# Patient Record
Sex: Male | Born: 1955 | Race: White | Hispanic: No | Smoking: Current every day smoker
Health system: Southern US, Community
[De-identification: ages and names within clinical notes are randomized; demographics above are authoritative.]

## PROBLEM LIST (undated history)

## (undated) DIAGNOSIS — R0602 Shortness of breath: Secondary | ICD-10-CM

## (undated) DIAGNOSIS — M1990 Unspecified osteoarthritis, unspecified site: Secondary | ICD-10-CM

## (undated) HISTORY — PX: COLONOSCOPY: SHX174

---

## 2004-12-31 ENCOUNTER — Emergency Department: Payer: Self-pay | Admitting: Unknown Physician Specialty

## 2007-03-01 ENCOUNTER — Ambulatory Visit: Payer: Self-pay | Admitting: Gastroenterology

## 2012-12-06 ENCOUNTER — Ambulatory Visit: Payer: Self-pay

## 2014-05-03 IMAGING — CR DG CHEST 2V
1 series · 2 of 2 positions shown · non-contrast
Comparison: none

REASON FOR EXAM: cough smoker
COMMENTS:

[Series 1: pa · 0.17mm/px · 2 of 2 slices shown]
[im 1/2]
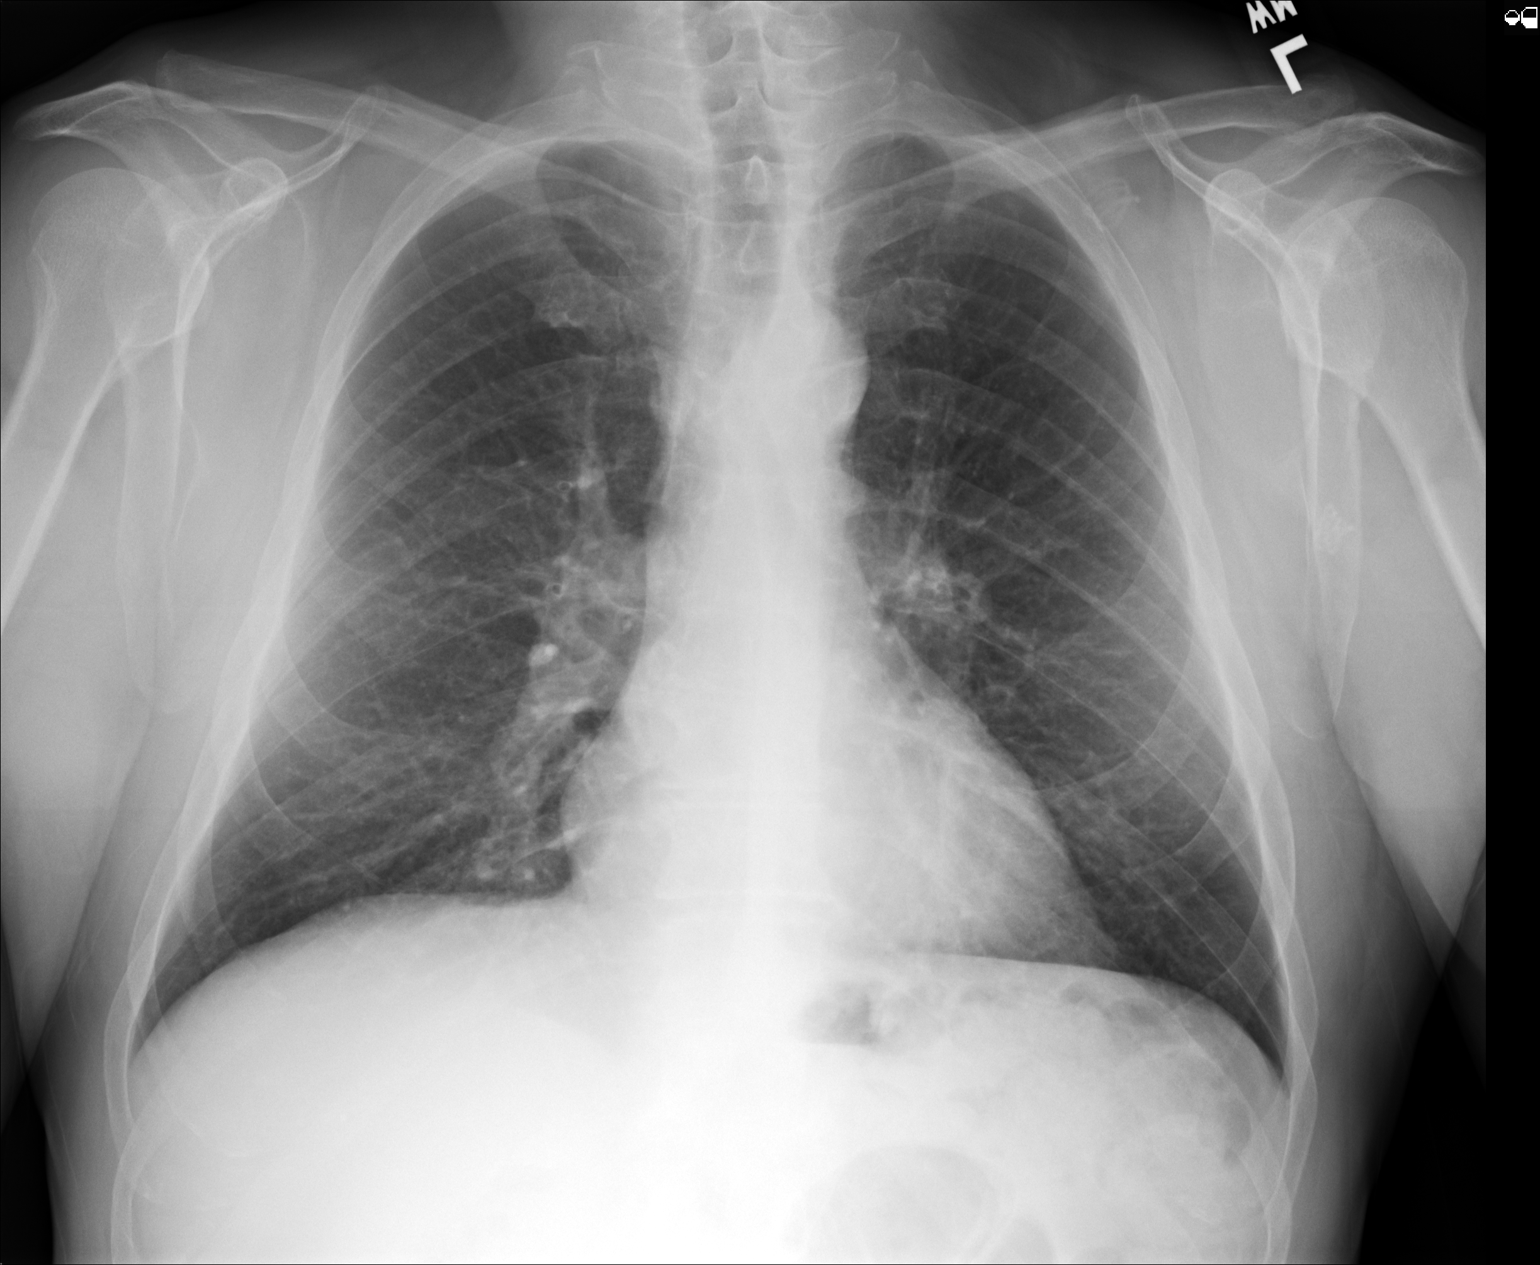
[im 2/2]
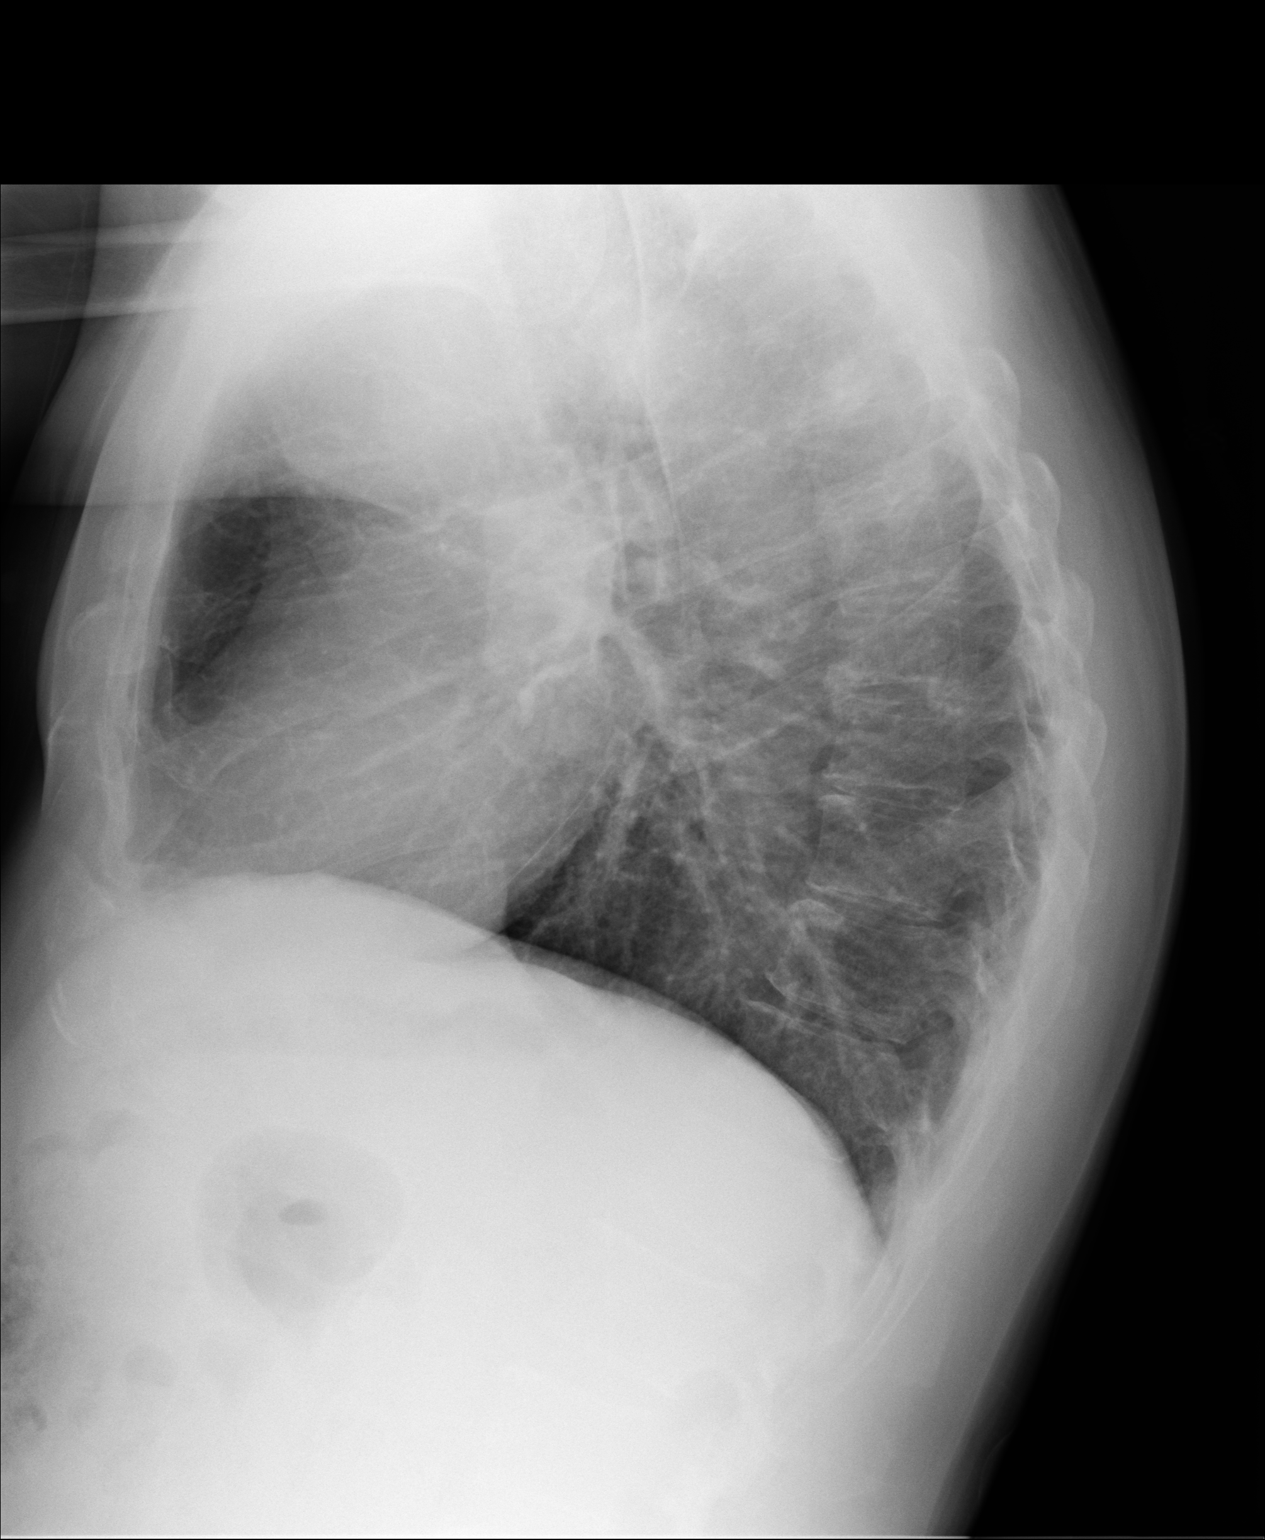

[2 of 2 positions shown; findings below may reference images not displayed]

PROCEDURE:     CECILE - CECILE CHEST PA (OR AP) AND LAT  - December 06, 2012  [DATE]

RESULT:     The lungs are adequately inflated. There is no focal infiltrate.
The cardiac silhouette is not enlarged. The central pulmonary vascularity is
mildly prominent. There is no pleural effusion or pneumothorax.
IMPRESSION: There is no evidence of pneumonia nor CHF. I cannot exclude
acute bronchitis in the appropriate clinical setting.

[REDACTED]

## 2020-06-19 ENCOUNTER — Emergency Department
Admission: EM | Admit: 2020-06-19 | Discharge: 2020-06-19 | Disposition: A | Discharge status: Home / Self Care | Payer: Medicare Other | Attending: Emergency Medicine | Admitting: Emergency Medicine

## 2020-06-19 ENCOUNTER — Emergency Department: Payer: Medicare Other

## 2020-06-19 ENCOUNTER — Other Ambulatory Visit: Payer: Self-pay

## 2020-06-19 DIAGNOSIS — Z23 Encounter for immunization: Secondary | ICD-10-CM | POA: Diagnosis not present

## 2020-06-19 DIAGNOSIS — Z2914 Encounter for prophylactic rabies immune globin: Secondary | ICD-10-CM | POA: Insufficient documentation

## 2020-06-19 DIAGNOSIS — S61051A Open bite of right thumb without damage to nail, initial encounter: Secondary | ICD-10-CM | POA: Diagnosis present

## 2020-06-19 DIAGNOSIS — Z203 Contact with and (suspected) exposure to rabies: Secondary | ICD-10-CM | POA: Insufficient documentation

## 2020-06-19 DIAGNOSIS — W540XXA Bitten by dog, initial encounter: Secondary | ICD-10-CM | POA: Diagnosis not present

## 2020-06-19 DIAGNOSIS — S61451A Open bite of right hand, initial encounter: Secondary | ICD-10-CM

## 2020-06-19 LAB — BASIC METABOLIC PANEL
Anion gap: 14 (ref 5–15)
BUN: 13 mg/dL (ref 8–23)
CO2: 21 mmol/L — ABNORMAL LOW (ref 22–32)
Calcium: 9 mg/dL (ref 8.9–10.3)
Chloride: 100 mmol/L (ref 98–111)
Creatinine, Ser: 1.06 mg/dL (ref 0.61–1.24)
GFR, Estimated: >60 mL/min (ref >60)
Glucose, Bld: 110 mg/dL — ABNORMAL HIGH (ref 70–99)
Potassium: 4 mmol/L (ref 3.5–5.1)
Sodium: 135 mmol/L (ref 135–145)

## 2020-06-19 LAB — CBC WITH DIFFERENTIAL/PLATELET
Abs Immature Granulocytes: 0.21 10*3/uL — ABNORMAL HIGH (ref 0.00–0.07)
Basophils Absolute: 0.1 10*3/uL (ref 0.0–0.1)
Basophils Relative: 0 %
Eosinophils Absolute: 0 10*3/uL (ref 0.0–0.5)
Eosinophils Relative: 0 %
HCT: 40.6 % (ref 39.0–52.0)
Hemoglobin: 13.9 g/dL (ref 13.0–17.0)
Immature Granulocytes: 1 %
Lymphocytes Relative: 6 %
Lymphs Abs: 1 10*3/uL (ref 0.7–4.0)
MCH: 38 pg — ABNORMAL HIGH (ref 26.0–34.0)
MCHC: 34.2 g/dL (ref 30.0–36.0)
MCV: 110.9 fL — ABNORMAL HIGH (ref 80.0–100.0)
Monocytes Absolute: 0.8 10*3/uL (ref 0.1–1.0)
Monocytes Relative: 5 %
Neutro Abs: 14.9 10*3/uL — ABNORMAL HIGH (ref 1.7–7.7)
Neutrophils Relative %: 88 %
Platelets: 154 10*3/uL (ref 150–400)
RBC: 3.66 MIL/uL — ABNORMAL LOW (ref 4.22–5.81)
RDW: 14.7 % (ref 11.5–15.5)
Smear Review: NORMAL
WBC: 17.2 10*3/uL — ABNORMAL HIGH (ref 4.0–10.5)
nRBC: 0.2 % (ref 0.0–0.2)

## 2020-06-19 MED ORDER — KETOROLAC TROMETHAMINE 30 MG/ML IJ SOLN
30.0000 mg | Freq: Once | INTRAMUSCULAR | Status: AC
  Administered 2020-06-19: 30 mg via INTRAVENOUS
  Filled 2020-06-19: qty 1

## 2020-06-19 MED ORDER — RABIES VACCINE, PCEC IM SUSR
1.0000 mL | Freq: Once | INTRAMUSCULAR | Status: AC
  Administered 2020-06-19: 1 mL via INTRAMUSCULAR
  Filled 2020-06-19: qty 1

## 2020-06-19 MED ORDER — TETANUS-DIPHTH-ACELL PERTUSSIS 5-2.5-18.5 LF-MCG/0.5 IM SUSP
0.5000 mL | Freq: Once | INTRAMUSCULAR | Status: AC
  Administered 2020-06-19: 0.5 mL via INTRAMUSCULAR
  Filled 2020-06-19: qty 0.5

## 2020-06-19 MED ORDER — TRAMADOL HCL 50 MG PO TABS
50.0000 mg | ORAL_TABLET | Freq: Once | ORAL | Status: AC
  Administered 2020-06-19: 50 mg via ORAL
  Filled 2020-06-19: qty 1

## 2020-06-19 MED ORDER — TRAMADOL HCL 50 MG PO TABS
50.0000 mg | ORAL_TABLET | Freq: Four times a day (QID) | ORAL | 0 refills | Status: AC | PRN
Start: 2020-06-19 — End: 2021-06-19

## 2020-06-19 MED ORDER — SODIUM CHLORIDE 0.9 % IV SOLN
1.0000 g | Freq: Once | INTRAVENOUS | Status: AC
  Administered 2020-06-19: 1 g via INTRAVENOUS
  Filled 2020-06-19: qty 10

## 2020-06-19 MED ORDER — RABIES IMMUNE GLOBULIN 150 UNIT/ML IM INJ
20.0000 [IU]/kg | INJECTION | Freq: Once | INTRAMUSCULAR | Status: AC
  Administered 2020-06-19: 1575 [IU] via INTRAMUSCULAR
  Filled 2020-06-19: qty 10.5

## 2020-06-19 MED ORDER — NAPROXEN 500 MG PO TABS: 500.0000 mg | ORAL_TABLET | Freq: Two times a day (BID) | ORAL | Status: DC

## 2020-06-19 MED ORDER — AMOXICILLIN-POT CLAVULANATE 875-125 MG PO TABS: 1.0000 | ORAL_TABLET | Freq: Two times a day (BID) | ORAL | 0 refills | Status: DC

## 2020-06-19 NOTE — ED Triage Notes (Signed)
Pt here with an animal bite to the left hand from a dog. Pt did not know of the dog or the owner. Pt c/o pain in the hand. Hand is red and swollen. Pt NAD in triage.

## 2020-06-19 NOTE — ED Notes (Signed)
Pt presents to the ED for a dog bite. Pt states that he does not know the dog or the owner. Has not notified animal control. Swelling and redness noted to R hand between thumb and index finger. Pt is A&Ox4 and NAD.

## 2020-06-19 NOTE — ED Provider Notes (Signed)
[facility] Emergency Department Provider Note   ____________________________________________   First MD Initiated Contact with Patient 06/19/20 1242     (approximate)  I have reviewed the triage vital signs and the nursing notes.   HISTORY  Chief Complaint Animal Bite    HPI Mark Best is a 64 y.o. male patient presents with dog bite right right thumb which occurred 4 days ago.  Patient did not know the animal or the owner.  Patient state increased pain and swelling since the incident.  Patient denies loss sensation or function of the right thumb.  Patient is right-hand dominant.  Patient rates pain as 5/10.  Described pain as "sore".  No palliative measures for complaint.        No past medical history on file.  There are no problems to display for this patient.     Prior to Admission medications   Medication Sig Start Date End Date Taking? Authorizing Provider  amoxicillin-clavulanate (AUGMENTIN) 875-125 MG tablet Take 1 tablet by mouth 2 (two) times daily. 06/19/20   Sable Feil, PA-C  naproxen (NAPROSYN) 500 MG tablet Take 1 tablet (500 mg total) by mouth 2 (two) times daily with a meal. 06/19/20   Sable Feil, PA-C  traMADol (ULTRAM) 50 MG tablet Take 1 tablet (50 mg total) by mouth every 6 (six) hours as needed. 06/19/20 06/19/21  Sable Feil, PA-C    Allergies Patient has no allergy information on record.  No family history on file.  Social History Social History   Tobacco Use  . Smoking status: Not on file  Substance Use Topics  . Alcohol use: Not on file  . Drug use: Not on file    Review of Systems Constitutional: No fever/chills Eyes: No visual changes. ENT: No sore throat. Cardiovascular: Denies chest pain. Respiratory: Denies shortness of breath. Gastrointestinal: No abdominal pain.  No nausea, no vomiting.  No diarrhea.  No constipation. Genitourinary: Negative for dysuria. Musculoskeletal: Negative  for back pain. Skin: Edema and erythema right thumb.  Neurological: Negative for headaches, focal weakness or numbness.   ____________________________________________   PHYSICAL EXAM:  VITAL SIGNS: ED Triage Vitals  Enc Vitals Group     BP 06/19/20 1232 (!) 142/109     Pulse Rate 06/19/20 1232 95     Resp 06/19/20 1232 18     Temp 06/19/20 1232 97.9 F (36.6 C)     Temp Source 06/19/20 1232 Oral     SpO2 06/19/20 1232 100 %     Weight 06/19/20 1230 170 lb (77.1 kg)     Height 06/19/20 1230 6\' 1"  (1.854 m)     Head Circumference --      Peak Flow --      Pain Score 06/19/20 1230 5     Pain Loc --      Pain Edu? --      Excl. in [place]? --     Constitutional: Alert and oriented. Well appearing and in no acute distress. Hematological/Lymphatic/Immunilogical: No cervical lymphadenopathy. Cardiovascular: Normal rate, regular rhythm. Grossly normal heart sounds.  Good peripheral circulation. Respiratory: Normal respiratory effort.  No retractions. Lungs CTAB. Musculoskeletal: No lower extremity tenderness nor edema.  No joint effusions. Neurologic:  Normal speech and language. No gross focal neurologic deficits are appreciated. No gait instability. Skin: Edema and erythema dorsal right thumb.   Psychiatric: Mood and affect are normal. Speech and behavior are normal.  ____________________________________________   LABS (all labs ordered  are listed, but only abnormal results are displayed)  Labs Reviewed  CBC WITH DIFFERENTIAL/PLATELET - Abnormal; Notable for the following components:      Result Value   WBC 17.2 (*)    RBC 3.66 (*)    MCV 110.9 (*)    MCH 38.0 (*)    Neutro Abs 14.9 (*)    Abs Immature Granulocytes 0.21 (*)    All other components within normal limits  BASIC METABOLIC PANEL - Abnormal; Notable for the following components:   CO2 21 (*)    Glucose, Bld 110 (*)    All other components within normal limits    ____________________________________________  EKG   ____________________________________________  RADIOLOGY I, Sable Feil, personally viewed and evaluated these images (plain radiographs) as part of my medical decision making, as well as reviewing the written report by the radiologist.  ED MD interpretation: No acute findings on x-ray of the right hand.  Official radiology report(s): DG Hand Complete Right  Result Date: 06/19/2020 CLINICAL DATA:  Animal bite. EXAM: RIGHT HAND - COMPLETE 3+ VIEW COMPARISON:  No prior. FINDINGS: Diffuse degenerative change. No evidence of acute fracture dislocation. Angulation deformity noted the right fifth metacarpal most likely from old healed fracture. No bony erosions. No radiopaque foreign body. IMPRESSION: 1. Diffuse degenerative change. No acute abnormality. No radiopaque foreign body. 2. Old healed right fifth metacarpal fracture. Electronically Signed   By: Marcello Moores  Register   On: 06/19/2020 12:53    ____________________________________________   PROCEDURES  Procedure(s) performed (including Critical Care):  Procedures   ____________________________________________   INITIAL IMPRESSION / ASSESSMENT AND PLAN / ED COURSE  As part of my medical decision making, I reviewed the following data within the [place]     Patient presents with a 63-day-old dog bite to the right thumb.  There is mild edema and erythema.  Discussed rationale for the wound closure.  Patient given rabies injections and advised on follow-up dates to complete the series.  Patient also given prescription for Augmentin, naproxen, and tramadol.        ____________________________________________   FINAL CLINICAL IMPRESSION(S) / ED DIAGNOSES  Final diagnoses:  Dog bite of right hand, initial encounter     ED Discharge Orders         Ordered    amoxicillin-clavulanate (AUGMENTIN) 875-125 MG tablet  2 times daily        06/19/20 1423     naproxen (NAPROSYN) 500 MG tablet  2 times daily with meals        06/19/20 1423    traMADol (ULTRAM) 50 MG tablet  Every 6 hours PRN        06/19/20 1423          *Please note:  Mark Best was evaluated in Emergency Department on 06/19/2020 for the symptoms described in the history of present illness. He was evaluated in the context of the global COVID-19 pandemic, which necessitated consideration that the patient might be at risk for infection with the SARS-CoV-2 virus that causes COVID-19. Institutional protocols and algorithms that pertain to the evaluation of patients at risk for COVID-19 are in a state of rapid change based on information released by regulatory bodies including the CDC and federal and state organizations. These policies and algorithms were followed during the patient's care in the ED.  Some ED evaluations and interventions may be delayed as a result of limited staffing during and the pandemic.*   Note:  This document was  prepared using Systems analyst and may include unintentional dictation errors.    Sable Feil, PA-C 06/19/20 1431    Naaman Plummer, MD 06/19/20 [phone]

## 2020-06-19 NOTE — Discharge Instructions (Addendum)
Follow-up on on 23 October, 27 October, and 30 October to complete rabies series.  Take medication as directed.

## 2020-06-22 ENCOUNTER — Other Ambulatory Visit: Payer: Self-pay

## 2020-06-22 ENCOUNTER — Encounter: Payer: Self-pay | Admitting: Emergency Medicine

## 2020-06-22 ENCOUNTER — Emergency Department
Admission: EM | Admit: 2020-06-22 | Discharge: 2020-06-22 | Disposition: A | Discharge status: Home / Self Care | Payer: Medicare Other | Attending: Emergency Medicine | Admitting: Emergency Medicine

## 2020-06-22 DIAGNOSIS — Z203 Contact with and (suspected) exposure to rabies: Secondary | ICD-10-CM | POA: Insufficient documentation

## 2020-06-22 DIAGNOSIS — Z23 Encounter for immunization: Secondary | ICD-10-CM | POA: Insufficient documentation

## 2020-06-22 DIAGNOSIS — Z2914 Encounter for prophylactic rabies immune globin: Secondary | ICD-10-CM

## 2020-06-22 MED ORDER — RABIES VACCINE, PCEC IM SUSR
1.0000 mL | Freq: Once | INTRAMUSCULAR | Status: AC
  Administered 2020-06-22: 1 mL via INTRAMUSCULAR
  Filled 2020-06-22: qty 1

## 2020-06-22 NOTE — Discharge Instructions (Addendum)
Return to ER for remaining injections

## 2020-06-22 NOTE — ED Triage Notes (Signed)
Pt here for second rabies injection

## 2020-06-22 NOTE — ED Provider Notes (Signed)
[facility] Emergency Department Provider Note   ____________________________________________   First MD Initiated Contact with Patient 06/22/20 [phone]     (approximate)  I have reviewed the triage vital signs and the nursing notes.   HISTORY  Chief Complaint Rabies Injection   HPI Mark Best is a 64 y.o. male presents to the ED for his second rabies injection.  Patient was seen in the ED on 06/19/2020 after dog bite to his right hand.  He denies any new symptoms or complaints.  He had no difficulties with the first set of injections.       History reviewed. No pertinent past medical history.  There are no problems to display for this patient.   Prior to Admission medications   Medication Sig Start Date End Date Taking? Authorizing Provider  amoxicillin-clavulanate (AUGMENTIN) 875-125 MG tablet Take 1 tablet by mouth 2 (two) times daily. 06/19/20   Sable Feil, PA-C  naproxen (NAPROSYN) 500 MG tablet Take 1 tablet (500 mg total) by mouth 2 (two) times daily with a meal. 06/19/20   Sable Feil, PA-C  traMADol (ULTRAM) 50 MG tablet Take 1 tablet (50 mg total) by mouth every 6 (six) hours as needed. 06/19/20 06/19/21  Sable Feil, PA-C    Allergies Patient has no allergy information on record.  No family history on file.  Social History Social History   Tobacco Use  . Smoking status: Not on file  Substance Use Topics  . Alcohol use: Not on file  . Drug use: Not on file    Review of Systems Constitutional: No fever/chills Eyes: No visual changes. Cardiovascular: Denies chest pain. Respiratory: Denies shortness of breath. Gastrointestinal: No abdominal pain.  No nausea, no vomiting.  Musculoskeletal: Negative for back pain. Skin: Dog bite right hand Neurological: Negative for headaches, focal weakness or numbness. ____________________________________________   PHYSICAL EXAM:  VITAL SIGNS: ED Triage Vitals [06/22/20  0832]  Enc Vitals Group     BP      Pulse      Resp      Temp      Temp src      SpO2      Weight 170 lb (77.1 kg)     Height 6' (1.829 m)     Head Circumference      Peak Flow      Pain Score 0     Pain Loc      Pain Edu?      Excl. in [place]?     Constitutional: Alert and oriented. Well appearing and in no acute distress. Eyes: Conjunctivae are normal.  Head: Atraumatic. Nose: No congestion/rhinnorhea. Neck: No stridor.   Cardiovascular: Normal rate, regular rhythm. Grossly normal heart sounds.  Good peripheral circulation. Respiratory: Normal respiratory effort.  No retractions. Lungs CTAB. Musculoskeletal: Right hand moves digits without difficulty.  No evidence of infection. Neurologic:  Normal speech and language. No gross focal neurologic deficits are appreciated. No gait instability. Skin:  Skin is warm, dry. No rash noted. Psychiatric: Mood and affect are normal. Speech and behavior are normal.  ____________________________________________   LABS (all labs ordered are listed, but only abnormal results are displayed)  Labs Reviewed - No data to display   PROCEDURES  Procedure(s) performed (including Critical Care):  Procedures   ____________________________________________   INITIAL IMPRESSION / ASSESSMENT AND PLAN / ED COURSE  As part of my medical decision making, I reviewed the following data within the electronic medical  record:  Notes from prior ED visits and [place] Controlled Substance Database  64 year old male presents to the ED for his second rabies vaccine.  Patient denies any difficulties with the initial prophylactic rabies vaccine.  He denies any difficulty at the site of his injury.  Patient is encouraged to return for his remaining vaccine.  ____________________________________________   FINAL CLINICAL IMPRESSION(S) / ED DIAGNOSES  Final diagnoses:  Encounter for prophylactic administration of rabies immune globulin     ED Discharge  Orders    None      *Please note:  Mark Best was evaluated in Emergency Department on 06/22/2020 for the symptoms described in the history of present illness. He was evaluated in the context of the global COVID-19 pandemic, which necessitated consideration that the patient might be at risk for infection with the SARS-CoV-2 virus that causes COVID-19. Institutional protocols and algorithms that pertain to the evaluation of patients at risk for COVID-19 are in a state of rapid change based on information released by regulatory bodies including the CDC and federal and state organizations. These policies and algorithms were followed during the patient's care in the ED.  Some ED evaluations and interventions may be delayed as a result of limited staffing during and the pandemic.*   Note:  This document was prepared using Dragon voice recognition software and may include unintentional dictation errors.    Johnn Hai, PA-C 06/22/20 [id number]    Lavonia Drafts, MD 06/22/20 [phone]

## 2020-06-26 ENCOUNTER — Other Ambulatory Visit: Payer: Self-pay

## 2020-06-26 ENCOUNTER — Emergency Department
Admission: EM | Admit: 2020-06-26 | Discharge: 2020-06-26 | Disposition: A | Discharge status: Home / Self Care | Payer: Medicare Other | Attending: Physician Assistant | Admitting: Physician Assistant

## 2020-06-26 DIAGNOSIS — Z203 Contact with and (suspected) exposure to rabies: Secondary | ICD-10-CM | POA: Insufficient documentation

## 2020-06-26 DIAGNOSIS — Z23 Encounter for immunization: Secondary | ICD-10-CM | POA: Insufficient documentation

## 2020-06-26 DIAGNOSIS — F17210 Nicotine dependence, cigarettes, uncomplicated: Secondary | ICD-10-CM | POA: Diagnosis not present

## 2020-06-26 MED ORDER — RABIES VACCINE, PCEC IM SUSR
1.0000 mL | Freq: Once | INTRAMUSCULAR | Status: AC
  Administered 2020-06-26: 1 mL via INTRAMUSCULAR
  Filled 2020-06-26: qty 1

## 2020-06-26 NOTE — ED Provider Notes (Signed)
[facility] Emergency Department Provider Note   ____________________________________________   None    (approximate)  I have reviewed the triage vital signs and the nursing notes.   HISTORY  Chief Complaint Rabies Injection    HPI Mark Best is a 64 y.o. male patient presents for his third rabies shot secondary to unprovoked attack dog bite.  Patient states no adverse reaction to previous injections.  Patient continues take antibiotics as directed.  Voices no complaints with medication or healing process.         History reviewed. No pertinent past medical history.  There are no problems to display for this patient.   History reviewed. No pertinent surgical history.  Prior to Admission medications   Medication Sig Start Date End Date Taking? Authorizing Provider  amoxicillin-clavulanate (AUGMENTIN) 875-125 MG tablet Take 1 tablet by mouth 2 (two) times daily. 06/19/20   Sable Feil, PA-C  naproxen (NAPROSYN) 500 MG tablet Take 1 tablet (500 mg total) by mouth 2 (two) times daily with a meal. 06/19/20   Sable Feil, PA-C  traMADol (ULTRAM) 50 MG tablet Take 1 tablet (50 mg total) by mouth every 6 (six) hours as needed. 06/19/20 06/19/21  Sable Feil, PA-C    Allergies Azithromycin  No family history on file.  Social History Social History   Tobacco Use  . Smoking status: Current Every Day Smoker    Types: Cigarettes  . Smokeless tobacco: Never Used  Substance Use Topics  . Alcohol use: Yes  . Drug use: Not Currently    Review of Systems Constitutional: No fever/chills Eyes: No visual changes. ENT: No sore throat. Cardiovascular: Denies chest pain. Respiratory: Denies shortness of breath. Gastrointestinal: No abdominal pain.  No nausea, no vomiting.  No diarrhea.  No constipation. Genitourinary: Negative for dysuria. Musculoskeletal: Negative for back pain. Skin: Negative for rash.  Redness swelling right  thumb. Neurological: Negative for headaches, focal weakness or numbness. Allergic/Immunilogical: Zithromax  ____________________________________________   PHYSICAL EXAM:  VITAL SIGNS: ED Triage Vitals  Enc Vitals Group     BP 06/26/20 0833 (!) 170/96     Pulse Rate 06/26/20 0833 67     Resp 06/26/20 0833 17     Temp 06/26/20 0833 97.9 F (36.6 C)     Temp Source 06/26/20 0833 Oral     SpO2 06/26/20 0833 100 %     Weight 06/26/20 0834 170 lb (77.1 kg)     Height 06/26/20 0834 6' (1.829 m)     Head Circumference --      Peak Flow --      Pain Score 06/26/20 0833 0     Pain Loc --      Pain Edu? --      Excl. in [place]? --     Constitutional: Alert and oriented. Well appearing and in no acute distress. Cardiovascular: Normal rate, regular rhythm. Grossly normal heart sounds.  Good peripheral circulation.  Very blood pressure. Respiratory: Normal respiratory effort.  No retractions. Lungs CTAB. Neurologic:  Normal speech and language. No gross focal neurologic deficits are appreciated. No gait instability. Skin:  Skin is warm, dry and intact. No rash noted.  Decreased erythema and edema to the left thumb from previous exam. Psychiatric: Mood and affect are normal. Speech and behavior are normal.  ____________________________________________   LABS (all labs ordered are listed, but only abnormal results are displayed)  Labs Reviewed - No data to display ____________________________________________  EKG  ____________________________________________  [place], personally viewed and evaluated these images (plain radiographs) as part of my medical decision making, as well as reviewing the written report by the radiologist.  ED MD interpretation:    Official radiology report(s): No results found.  ____________________________________________   PROCEDURES  Procedure(s) performed (including Critical  Care):  Procedures   ____________________________________________   INITIAL IMPRESSION / ASSESSMENT AND PLAN / ED COURSE  As part of my medical decision making, I reviewed the following data within the [place]             Patient presents for third rabies shot secondary to a dog bite to the right thumb.  Patient given discharge care instruction advised return in 1 week for final injection. ____________________________________________   FINAL CLINICAL IMPRESSION(S) / ED DIAGNOSES  Final diagnoses:  Contact with and (suspected) exposure to rabies     ED Discharge Orders    None      *Please note:  XAYNE BRUMBAUGH was evaluated in Emergency Department on 06/26/2020 for the symptoms described in the history of present illness. He was evaluated in the context of the global COVID-19 pandemic, which necessitated consideration that the patient might be at risk for infection with the SARS-CoV-2 virus that causes COVID-19. Institutional protocols and algorithms that pertain to the evaluation of patients at risk for COVID-19 are in a state of rapid change based on information released by regulatory bodies including the CDC and federal and state organizations. These policies and algorithms were followed during the patient's care in the ED.  Some ED evaluations and interventions may be delayed as a result of limited staffing during and the pandemic.*   Note:  This document was prepared using Dragon voice recognition software and may include unintentional dictation errors.    Sable Feil, PA-C 06/26/20 1610    Carrie Mew, MD 06/26/20 1257

## 2020-06-26 NOTE — Discharge Instructions (Signed)
Follow-up in 1 week to complete rabies series.

## 2020-06-26 NOTE — ED Triage Notes (Signed)
Pt is here for his 3rd rabies injection

## 2020-06-29 ENCOUNTER — Encounter: Payer: Self-pay | Admitting: Emergency Medicine

## 2020-06-29 ENCOUNTER — Emergency Department
Admission: EM | Admit: 2020-06-29 | Discharge: 2020-06-29 | Disposition: A | Discharge status: Home / Self Care | Payer: Medicare Other | Attending: Emergency Medicine | Admitting: Emergency Medicine

## 2020-06-29 ENCOUNTER — Other Ambulatory Visit: Payer: Self-pay

## 2020-06-29 DIAGNOSIS — Z2914 Encounter for prophylactic rabies immune globin: Secondary | ICD-10-CM | POA: Insufficient documentation

## 2020-06-29 DIAGNOSIS — Z79899 Other long term (current) drug therapy: Secondary | ICD-10-CM | POA: Diagnosis not present

## 2020-06-29 DIAGNOSIS — Z203 Contact with and (suspected) exposure to rabies: Secondary | ICD-10-CM | POA: Insufficient documentation

## 2020-06-29 DIAGNOSIS — F17210 Nicotine dependence, cigarettes, uncomplicated: Secondary | ICD-10-CM | POA: Diagnosis not present

## 2020-06-29 DIAGNOSIS — Z23 Encounter for immunization: Secondary | ICD-10-CM | POA: Diagnosis not present

## 2020-06-29 MED ORDER — RABIES VACCINE, PCEC IM SUSR
1.0000 mL | Freq: Once | INTRAMUSCULAR | Status: AC
  Administered 2020-06-29: 1 mL via INTRAMUSCULAR
  Filled 2020-06-29: qty 1

## 2020-06-29 NOTE — Discharge Instructions (Addendum)
You have completed your rabies series.

## 2020-06-29 NOTE — ED Triage Notes (Signed)
Pt here for last rabies injection 

## 2020-06-29 NOTE — ED Provider Notes (Signed)
[facility] Emergency Department Provider Note   ____________________________________________   First MD Initiated Contact with Patient 06/29/20 0820     (approximate)  I have reviewed the triage vital signs and the nursing notes.   HISTORY  Chief Complaint Rabies Injection    HPI Mark Best is a 64 y.o. male patient here for last rabies vaccine injection.  Patient voiced no concerns or complaints from previous injections.         History reviewed. No pertinent past medical history.  There are no problems to display for this patient.   History reviewed. No pertinent surgical history.  Prior to Admission medications   Medication Sig Start Date End Date Taking? Authorizing Provider  amoxicillin-clavulanate (AUGMENTIN) 875-125 MG tablet Take 1 tablet by mouth 2 (two) times daily. 06/19/20   Sable Feil, PA-C  naproxen (NAPROSYN) 500 MG tablet Take 1 tablet (500 mg total) by mouth 2 (two) times daily with a meal. 06/19/20   Sable Feil, PA-C  traMADol (ULTRAM) 50 MG tablet Take 1 tablet (50 mg total) by mouth every 6 (six) hours as needed. 06/19/20 06/19/21  Sable Feil, PA-C    Allergies Azithromycin  No family history on file.  Social History Social History   Tobacco Use  . Smoking status: Current Every Day Smoker    Types: Cigarettes  . Smokeless tobacco: Never Used  Substance Use Topics  . Alcohol use: Yes  . Drug use: Not Currently    Review of Systems Constitutional: No fever/chills Eyes: No visual changes. ENT: No sore throat. Cardiovascular: Denies chest pain. Respiratory: Denies shortness of breath. Gastrointestinal: No abdominal pain.  No nausea, no vomiting.  No diarrhea.  No constipation. Genitourinary: Negative for dysuria. Musculoskeletal: Negative for back pain. Skin: Negative for rash. Neurological: Negative for headaches, focal weakness or numbness. Endocrine:   Hypertension Allergic/Immunilogical: Azithromycin  ____________________________________________   PHYSICAL EXAM:  VITAL SIGNS: ED Triage Vitals  Enc Vitals Group     BP 06/29/20 0827 (!) 182/92     Pulse Rate 06/29/20 0827 64     Resp 06/29/20 0827 18     Temp 06/29/20 0827 98.2 F (36.8 C)     Temp Source 06/29/20 0827 Oral     SpO2 06/29/20 0827 100 %     Weight 06/29/20 0812 170 lb (77.1 kg)     Height 06/29/20 0812 6' (1.829 m)     Head Circumference --      Peak Flow --      Pain Score 06/29/20 0812 0     Pain Loc --      Pain Edu? --      Excl. in [place]? --    Constitutional: Alert and oriented. Well appearing and in no acute distress. Cardiovascular: Normal rate, regular rhythm. Grossly normal heart sounds.  Good peripheral circulation.  Elevated blood pressure Respiratory: Normal respiratory effort.  No retractions. Lungs CTAB. Skin: Healing bite wound to right thumb.  Psychiatric: Mood and affect are normal. Speech and behavior are normal.  ____________________________________________   LABS (all labs ordered are listed, but only abnormal results are displayed)  Labs Reviewed - No data to display ____________________________________________  EKG   ____________________________________________  RADIOLOGY I, Sable Feil, personally viewed and evaluated these images (plain radiographs) as part of my medical decision making, as well as reviewing the written report by the radiologist.  ED MD interpretation:    Official radiology report(s): No results found.  ____________________________________________  PROCEDURES  Procedure(s) performed (including Critical Care):  Procedures   ____________________________________________   INITIAL IMPRESSION / ASSESSMENT AND PLAN / ED COURSE  As part of my medical decision making, I reviewed the following data within the [place]         Patient presents for final rabies vaccine  injection.  Patient given discharge care instruction advised follow-up PCP.      ____________________________________________   FINAL CLINICAL IMPRESSION(S) / ED DIAGNOSES  Final diagnoses:  Suspected rabies     ED Discharge Orders    None      *Please note:  Mark Best was evaluated in Emergency Department on 06/29/2020 for the symptoms described in the history of present illness. He was evaluated in the context of the global COVID-19 pandemic, which necessitated consideration that the patient might be at risk for infection with the SARS-CoV-2 virus that causes COVID-19. Institutional protocols and algorithms that pertain to the evaluation of patients at risk for COVID-19 are in a state of rapid change based on information released by regulatory bodies including the CDC and federal and state organizations. These policies and algorithms were followed during the patient's care in the ED.  Some ED evaluations and interventions may be delayed as a result of limited staffing during and the pandemic.*   Note:  This document was prepared using Dragon voice recognition software and may include unintentional dictation errors.    Sable Feil, PA-C 06/29/20 [id number]    Delman Kitten, MD 06/29/20 1316

## 2021-11-05 ENCOUNTER — Other Ambulatory Visit: Payer: Self-pay

## 2021-11-05 ENCOUNTER — Emergency Department: Payer: Medicare Other

## 2021-11-05 ENCOUNTER — Emergency Department
Admission: EM | Admit: 2021-11-05 | Discharge: 2021-11-05 | Disposition: A | Discharge status: Home / Self Care | Payer: Medicare Other | Attending: Emergency Medicine | Admitting: Emergency Medicine

## 2021-11-05 ENCOUNTER — Encounter: Payer: Self-pay | Admitting: Emergency Medicine

## 2021-11-05 DIAGNOSIS — M545 Low back pain, unspecified: Secondary | ICD-10-CM | POA: Diagnosis present

## 2021-11-05 DIAGNOSIS — M4716 Other spondylosis with myelopathy, lumbar region: Secondary | ICD-10-CM | POA: Insufficient documentation

## 2021-11-05 LAB — URINALYSIS, ROUTINE W REFLEX MICROSCOPIC
Bacteria, UA: NONE SEEN
Glucose, UA: NEGATIVE mg/dL
Ketones, ur: 5 mg/dL — AB
Leukocytes,Ua: NEGATIVE
Nitrite: NEGATIVE
Protein, ur: >=300 mg/dL — AB
Specific Gravity, Urine: 1.028 (ref 1.005–1.030)
pH: 5 (ref 5.0–8.0)

## 2021-11-05 MED ORDER — KETOROLAC TROMETHAMINE 30 MG/ML IJ SOLN
30.0000 mg | Freq: Once | INTRAMUSCULAR | Status: AC
  Administered 2021-11-05: 30 mg via INTRAMUSCULAR
  Filled 2021-11-05: qty 1

## 2021-11-05 MED ORDER — MELOXICAM 15 MG PO TABS: 15.0000 mg | ORAL_TABLET | Freq: Every day | ORAL | 0 refills | Status: AC

## 2021-11-05 NOTE — ED Provider Notes (Signed)
? ?[facility] ?Provider Note ? ? ? Event Date/Time  ? First MD Initiated Contact with Patient 11/05/21 1357   ?  (approximate) ? ? ?History  ? ?Back Pain ? ? ?HPI ? ?Mark Best is a 66 y.o. male   presents to the ED with complaint of low back pain that started 1 week ago.  Patient denies any known injury to his back.  He denies any urinary symptoms or previous history of UTIs or stones.  Patient continues to ambulate slowly. ? ?  ? ? ?Physical Exam  ? ?Triage Vital Signs: ?ED Triage Vitals  ?Enc Vitals Group  ?   BP 11/05/21 1340 (!) 179/107  ?   Pulse Rate 11/05/21 1340 (!) 107  ?   Resp 11/05/21 1340 18  ?   Temp 11/05/21 1340 98.7 ?F (37.1 ?C)  ?   Temp Source 11/05/21 1340 Oral  ?   SpO2 11/05/21 1340 98 %  ?   Weight 11/05/21 1344 169 lb 15.6 oz (77.1 kg)  ?   Height 11/05/21 1344 6' (1.829 m)  ?   Head Circumference --   ?   Peak Flow --   ?   Pain Score 11/05/21 1344 10  ?   Pain Loc --   ?   Pain Edu? --   ?   Excl. in [place]? --   ? ? ?Most recent vital signs: ?Vitals:  ? 11/05/21 1340 11/05/21 1500  ?BP: (!) 179/107 (!) 173/103  ?Pulse: (!) 107   ?Resp: 18   ?Temp: 98.7 ?F (37.1 ?C)   ?SpO2: 98%   ? ? ? ?General: Awake, no distress.  ?CV:  Good peripheral perfusion.  Heart regular rate and rhythm. ?Resp:  Normal effort.  Lungs are clear. ?Abd:  No distention.  Soft, nontender bowel sounds normoactive. ?Other:  On palpation there is no point tenderness of the lumbar vertebral processes.  No deformity present.  No paravertebral muscles in the L5-S1 area bilaterally are tender to palpation.  Range of motion is slow and guarded secondary to discomfort. ? ? ?ED Results / Procedures / Treatments  ? ?Labs ?(all labs ordered are listed, but only abnormal results are displayed) ?Labs Reviewed  ?URINALYSIS, ROUTINE W REFLEX MICROSCOPIC - Abnormal; Notable for the following components:  ?    Result Value  ? Color, Urine AMBER (*)   ? APPearance HAZY (*)   ? Hgb urine dipstick SMALL (*)   ?  Bilirubin Urine SMALL (*)   ? Ketones, ur 5 (*)   ? Protein, ur >=300 (*)   ? All other components within normal limits  ? ? ? ?RADIOLOGY ? ?Lumbar spine x-ray is negative for any acute abnormality but does show degenerative changes. ? ? ?PROCEDURES: ? ?Critical Care performed:  ? ?Procedures ? ? ?MEDICATIONS ORDERED IN ED: ?Medications  ?ketorolac (TORADOL) 30 MG/ML injection 30 mg (30 mg Intramuscular Given 11/05/21 1504)  ? ? ? ?IMPRESSION / MDM / ASSESSMENT AND PLAN / ED COURSE  ?I reviewed the triage vital signs and the nursing notes. ? ? ?Differential diagnosis includes, but is not limited to, low back pain, urinary tract infection, kidney stone, muscle skeletal pain/strain. ? ?66 year old male presents to the ED with complaint of low back pain for approximately 1 week without history of injury.  Patient has been taking Tylenol without any relief.  Urinalysis was unremarkable with the exception of protein which was over 300  and 6-10 RBCs.  Physical  exam was positive for discomfort on palpation of the L5-S1 and sacral area.  X-rays show degenerative changes and patient was made aware.  He was given a prescription for meloxicam to take daily with food as needed.  He was also given a Toradol injection while in the ED.  He was made aware that he could continue taking Tylenol with these medications if additional pain medication is needed.  He is to follow-up with his as his blood pressure continued to be elevated while in the ED and he has been on blood pressure medicine in the past.  He reports he goes to [facility] and will make an appointment for follow-up and recheck of his blood pressure. ? ? ? ? ?  ? ? ?FINAL CLINICAL IMPRESSION(S) / ED DIAGNOSES  ? ?Final diagnoses:  ?Osteoarthritis of lumbar spine with myelopathy  ? ? ? ?Rx / DC Orders  ? ?ED Discharge Orders   ? ?      Ordered  ?  meloxicam (MOBIC) 15 MG tablet  Daily       ? 11/05/21 1503  ? ?  ?  ? ?  ? ? ? ?Note:  This document was prepared using  Dragon voice recognition software and may include unintentional dictation errors. ?  ?Johnn Hai, PA-C ?11/05/21 1650 ? ?  ?Naaman Plummer, MD ?11/05/21 1929 ? ?

## 2021-11-05 NOTE — ED Triage Notes (Signed)
Pt comes into the ED via POV c/o low back pain that started a week ago.  Pt denies any known injury to the back.  PT denies any urinary symptoms at this time.  Pt ambulatory to triage and in NAD with even and unlabored respirations.  ?

## 2021-11-05 NOTE — Discharge Instructions (Signed)
Follow-up with your primary care provider at [facility] for recheck of your blood pressure as it was elevated both times it was taken in the emergency department.  Blood pressure was 173/103 and it may be necessary for you to restart your blood pressure medication.  It may also be elevated due to your back pain.  An injection of Toradol was given to you while in the ED and a prescription for meloxicam was sent to the pharmacy to take once daily for arthritis.  Also you may take Tylenol with this medication if additional medicines as needed.  You may use ice or heat to your back as needed for discomfort. ?

## 2022-01-05 ENCOUNTER — Other Ambulatory Visit: Payer: Self-pay | Admitting: Physician Assistant

## 2022-01-05 DIAGNOSIS — R634 Abnormal weight loss: Secondary | ICD-10-CM

## 2022-01-14 ENCOUNTER — Ambulatory Visit
Admission: RE | Admit: 2022-01-14 | Discharge: 2022-01-14 | Disposition: A | Discharge status: Home / Self Care | Payer: Medicare Other | Source: Ambulatory Visit | Attending: Physician Assistant | Admitting: Physician Assistant

## 2022-01-14 DIAGNOSIS — R933 Abnormal findings on diagnostic imaging of other parts of digestive tract: Secondary | ICD-10-CM | POA: Insufficient documentation

## 2022-01-14 DIAGNOSIS — R932 Abnormal findings on diagnostic imaging of liver and biliary tract: Secondary | ICD-10-CM | POA: Insufficient documentation

## 2022-01-14 DIAGNOSIS — F17200 Nicotine dependence, unspecified, uncomplicated: Secondary | ICD-10-CM | POA: Diagnosis not present

## 2022-01-14 DIAGNOSIS — R109 Unspecified abdominal pain: Secondary | ICD-10-CM | POA: Diagnosis not present

## 2022-01-14 DIAGNOSIS — R634 Abnormal weight loss: Secondary | ICD-10-CM | POA: Insufficient documentation

## 2022-01-14 DIAGNOSIS — I700 Atherosclerosis of aorta: Secondary | ICD-10-CM | POA: Insufficient documentation

## 2022-01-14 LAB — POCT I-STAT CREATININE: Creatinine, Ser: 1.1 mg/dL (ref 0.61–1.24)

## 2022-01-14 MED ORDER — IOHEXOL 300 MG/ML  SOLN
100.0000 mL | Freq: Once | INTRAMUSCULAR | Status: AC | PRN
  Administered 2022-01-14: 100 mL via INTRAVENOUS

## 2022-01-18 NOTE — Progress Notes (Signed)
Gastroenterology Consultation  Referring Provider:     Practice, [facility] Primary Care Physician:  Practice, [facility] Family Primary Gastroenterologist:  Dr. Allen Norris     Reason for Consultation:     Abnormal CT scan        HPI:   Mark Best is a 66 y.o. y/o male referred for consultation & management of Abnormal CT scan by Dr. Donavan Foil, [facility].  As patient comes in to see me today after having an abnormal CT scan of the chest.  The patient had a colonoscopy in 2008 by me and does not appear to follow-up since then.  The patient CT scan showed:  IMPRESSION: 1. No acute abnormality identified in the chest. 2. Bowel wall thickening with question associated 2 adjacent masses throughout the right transverse colon. This is suspicious for malignancy. Further evaluation with colonoscopy is recommended. 3. Mild heterogeneous density mass in the liver, liver metastasis is not excluded. 4. Patchy lucency with sclerosis throughout the visualized thoracolumbar spine, manubrium and sternum, sacrum. Metastatic disease is not excluded.  He has lost 15 lbs in the last 2 months not trying. He denies rectal bleeding or black stools.  No past medical history on file.  No past surgical history on file.  Prior to Admission medications   Medication Sig Start Date End Date Taking? Authorizing Provider  meloxicam (MOBIC) 15 MG tablet Take 1 tablet (15 mg total) by mouth daily. 11/05/21 11/05/22  Johnn Hai, PA-C    No family history on file.   Social History   Tobacco Use   Smoking status: Every Day    Types: Cigarettes   Smokeless tobacco: Never  Substance Use Topics   Alcohol use: Yes   Drug use: Not Currently    Allergies as of 01/19/2022 - Review Complete 01/19/2022  Allergen Reaction Noted   Azithromycin Rash 06/26/2020    Review of Systems:    All systems reviewed and negative except where noted in HPI.   Physical Exam:  BP (!) 152/72 (BP  Location: Left Arm, Patient Position: Sitting, Cuff Size: Normal)   Pulse (!) 132   Ht 6' (1.829 m)   Wt 151 lb 9.6 oz (68.8 kg)   BMI 20.56 kg/m  No LMP for male patient. General:   Alert,  Well-developed, well-nourished, pleasant and cooperative in NAD Head:  Normocephalic and atraumatic. Eyes:  Sclera clear, no icterus.   Conjunctiva pink. Ears:  Normal auditory acuity. Neck:  Supple; no masses or thyromegaly. Lungs:  Respirations even and unlabored.  Diffuse rhonchi.   No wheezes, crackles, or rhonchi. No acute distress. Heart:  Regular rate and rhythm; no murmurs, clicks, rubs, or gallops. Abdomen:  Normal bowel sounds.  No bruits.  Soft, non-tender and non-distended without masses, hepatosplenomegaly or hernias noted.  No guarding or rebound tenderness.  Negative Carnett sign.   Rectal:  Deferred.  Pulses:  Normal pulses noted. Extremities:  No clubbing or edema.  No cyanosis. Neurologic:  Alert and oriented x3;  grossly normal neurologically. Skin:  Intact without significant lesions or rashes.  No jaundice. Lymph Nodes:  No significant cervical adenopathy. Psych:  Alert and cooperative. Normal mood and affect.  Imaging Studies: CT CHEST ABDOMEN PELVIS W CONTRAST  Result Date: 01/15/2022 CLINICAL DATA:  Smoker, abdominal pain and leg pain persistent. EXAM: CT CHEST, ABDOMEN, AND PELVIS WITH CONTRAST TECHNIQUE: Multidetector CT imaging of the chest, abdomen and pelvis was performed following the standard protocol during bolus administration of  intravenous contrast. RADIATION DOSE REDUCTION: This exam was performed according to the departmental dose-optimization program which includes automated exposure control, adjustment of the mA and/or kV according to patient size and/or use of iterative reconstruction technique. CONTRAST:  145m OMNIPAQUE IOHEXOL 300 MG/ML  SOLN COMPARISON:  None Available. FINDINGS: CT CHEST FINDINGS Cardiovascular: No significant vascular findings.  Atherosclerosis of the aorta is identified. Normal heart size. No pericardial effusion. Mediastinum/Nodes: No enlarged mediastinal, hilar, or axillary lymph nodes. Thyroid gland, trachea, and esophagus demonstrate no significant findings. Lungs/Pleura: Lungs are clear. No pleural effusion or pneumothorax. Musculoskeletal: Degenerative joint changes are identified throughout the spine. There are patchy lucency with sclerosis throughout the visualized thoracolumbar spine, manubrium and sternum, sacrum. CT ABDOMEN PELVIS FINDINGS Hepatobiliary: There is diffuse low density of the liver consistent with fatty infiltration. In the anterior segment right lobe liver, there is a 1.7 x 1.5 cm mild heterogeneous low-density mass. The gallbladder and biliary tree are normal. Pancreas: Unremarkable. No pancreatic ductal dilatation or surrounding inflammatory changes. Spleen: Normal in size without focal abnormality. Adrenals/Urinary Tract: Bilateral adrenal glands are normal there are small low-density lesions in both kidneys, largest measures 0.6 cm in the midpole left kidney consistent with simple cysts. No no hydronephrosis is identified bilaterally. The bladder is normal. Follow-up is necessary. Stomach/Bowel: In the right transverse colon, there is bowel wall thickening with question associated 2 adjacent masses, larger measures 2.3 x 2.6 cm. There is no small bowel obstruction. The appendix is not seen but no inflammation is noted cecum. The stomach is normal. Vascular/Lymphatic: Atherosclerosis of the abdominal aorta with calcified and noncalcified plaques are noted. No abdominal or pelvic lymphadenopathy are noted. Reproductive: Prostate is unremarkable. Other: None. Musculoskeletal: Degenerative joint changes are identified throughout the spine. There are patchy lucency with sclerosis throughout the visualized thoracolumbar spine, manubrium and sternum, sacrum. IMPRESSION: 1. No acute abnormality identified in the  chest. 2. Bowel wall thickening with question associated 2 adjacent masses throughout the right transverse colon. This is suspicious for malignancy. Further evaluation with colonoscopy is recommended. 3. Mild heterogeneous density mass in the liver, liver metastasis is not excluded. 4. Patchy lucency with sclerosis throughout the visualized thoracolumbar spine, manubrium and sternum, sacrum. Metastatic disease is not excluded. Aortic Atherosclerosis (ICD10-I70.0). Electronically Signed   By: WAbelardo DieselM.D.   On: 01/15/2022 11:20    Assessment and Plan:   PGARNETT NUNZIATAis a 66y.o. y/o male who comes in today after having a colonoscopy by me in 2008.  The patient then reports that he had a colonoscopy in 2015 had a private endoscopy center.  The patient now has lesions seen in his colon with possible metastatic lesions in his bones in his liver.  The patient will be set up for colonoscopy to look for a colon mass as seen on the CT scan.  The patient has been explained the plan and agrees with it.    DLucilla Lame MD. FMarval Regal   Note: This dictation was prepared with Dragon dictation along with smaller phrase technology. Any transcriptional errors that result from this process are unintentional.

## 2022-01-19 ENCOUNTER — Ambulatory Visit (INDEPENDENT_AMBULATORY_CARE_PROVIDER_SITE_OTHER): Payer: Medicare Other | Admitting: Gastroenterology

## 2022-01-19 ENCOUNTER — Other Ambulatory Visit: Payer: Self-pay

## 2022-01-19 ENCOUNTER — Encounter: Payer: Self-pay | Admitting: Gastroenterology

## 2022-01-19 VITALS — BP 152/72 | HR 132 | Ht 72.0 in | Wt 151.6 lb

## 2022-01-19 DIAGNOSIS — R933 Abnormal findings on diagnostic imaging of other parts of digestive tract: Secondary | ICD-10-CM

## 2022-01-19 MED ORDER — NA SULFATE-K SULFATE-MG SULF 17.5-3.13-1.6 GM/177ML PO SOLN: 1.0000 | Freq: Once | ORAL | 0 refills | Status: AC

## 2022-01-28 ENCOUNTER — Encounter: Payer: Self-pay | Admitting: Gastroenterology

## 2022-01-29 ENCOUNTER — Ambulatory Visit: Payer: Medicare Other | Admitting: Registered Nurse

## 2022-01-29 ENCOUNTER — Encounter: Payer: Self-pay | Admitting: Gastroenterology

## 2022-01-29 ENCOUNTER — Encounter
Admission: RE | Disposition: A | Discharge status: Home / Self Care | Payer: Self-pay | Source: Home / Self Care | Attending: Gastroenterology

## 2022-01-29 ENCOUNTER — Ambulatory Visit
Admission: RE | Admit: 2022-01-29 | Discharge: 2022-01-29 | Disposition: A | Discharge status: Home / Self Care | Payer: Medicare Other | Attending: Gastroenterology | Admitting: Gastroenterology

## 2022-01-29 ENCOUNTER — Telehealth: Payer: Self-pay

## 2022-01-29 ENCOUNTER — Other Ambulatory Visit: Payer: Self-pay

## 2022-01-29 DIAGNOSIS — K635 Polyp of colon: Secondary | ICD-10-CM

## 2022-01-29 DIAGNOSIS — D123 Benign neoplasm of transverse colon: Secondary | ICD-10-CM | POA: Diagnosis not present

## 2022-01-29 DIAGNOSIS — F17210 Nicotine dependence, cigarettes, uncomplicated: Secondary | ICD-10-CM | POA: Insufficient documentation

## 2022-01-29 DIAGNOSIS — J449 Chronic obstructive pulmonary disease, unspecified: Secondary | ICD-10-CM | POA: Insufficient documentation

## 2022-01-29 DIAGNOSIS — K641 Second degree hemorrhoids: Secondary | ICD-10-CM | POA: Diagnosis not present

## 2022-01-29 DIAGNOSIS — D125 Benign neoplasm of sigmoid colon: Secondary | ICD-10-CM | POA: Diagnosis not present

## 2022-01-29 DIAGNOSIS — R933 Abnormal findings on diagnostic imaging of other parts of digestive tract: Secondary | ICD-10-CM

## 2022-01-29 DIAGNOSIS — K6389 Other specified diseases of intestine: Secondary | ICD-10-CM

## 2022-01-29 HISTORY — DX: Shortness of breath: R06.02

## 2022-01-29 HISTORY — DX: Unspecified osteoarthritis, unspecified site: M19.90

## 2022-01-29 HISTORY — PX: COLONOSCOPY WITH PROPOFOL: SHX5780

## 2022-01-29 SURGERY — COLONOSCOPY WITH PROPOFOL
Anesthesia: General

## 2022-01-29 MED ORDER — PROPOFOL 1000 MG/100ML IV EMUL
INTRAVENOUS | Status: AC
  Filled 2022-01-29: qty 300

## 2022-01-29 MED ORDER — SODIUM CHLORIDE 0.9 % IV SOLN: INTRAVENOUS | Status: DC

## 2022-01-29 MED ORDER — PROPOFOL 500 MG/50ML IV EMUL
INTRAVENOUS | Status: DC | PRN
  Administered 2022-01-29: 140 ug/kg/min via INTRAVENOUS

## 2022-01-29 MED ORDER — LIDOCAINE HCL (CARDIAC) PF 100 MG/5ML IV SOSY
PREFILLED_SYRINGE | INTRAVENOUS | Status: DC | PRN
  Administered 2022-01-29: 60 mg via INTRAVENOUS

## 2022-01-29 MED ORDER — PROPOFOL 10 MG/ML IV BOLUS
INTRAVENOUS | Status: DC | PRN
  Administered 2022-01-29: 70 mg via INTRAVENOUS

## 2022-01-29 NOTE — H&P (Signed)
   Lucilla Lame, MD [address] [place], [place] [address] Phone:[phone] Fax : [phone]  Primary Care Physician:  Practice, [facility] Family Primary Gastroenterologist:  Dr. Allen Norris  Pre-Procedure History & Physical: HPI:  Mark Best is a 66 y.o. male is here for an colonoscopy.   Past Medical History:  Diagnosis Date   Arthritis    SOB (shortness of breath)     Past Surgical History:  Procedure Laterality Date   COLONOSCOPY      Prior to Admission medications   Medication Sig Start Date End Date Taking? Authorizing Provider  folic acid (FOLVITE) 1 MG tablet Take 1 mg by mouth daily. 01/05/22  Yes [provider]  meloxicam (MOBIC) 15 MG tablet Take 1 tablet (15 mg total) by mouth daily. 11/05/21 11/05/22 Yes Johnn Hai, PA-C  promethazine (PHENERGAN) 25 MG tablet Take 25 mg by mouth every 4 (four) hours as needed. 01/01/22  Yes [provider]  traZODone (DESYREL) 50 MG tablet PLEASE SEE ATTACHED FOR DETAILED DIRECTIONS 01/01/22  Yes [provider]    Allergies as of 01/19/2022 - Review Complete 01/19/2022  Allergen Reaction Noted   Azithromycin Rash 06/26/2020    History reviewed. No pertinent family history.  Social History   Socioeconomic History   Marital status: Divorced    Spouse name: Not on file   Number of children: Not on file   Years of education: Not on file   Highest education level: Not on file  Occupational History   Not on file  Tobacco Use   Smoking status: Every Day    Packs/day: 0.50    Types: Cigarettes   Smokeless tobacco: Never  Vaping Use   Vaping Use: Never used  Substance and Sexual Activity   Alcohol use: Yes    Alcohol/week: 23.0 standard drinks    Types: 21 Cans of beer, 2 Shots of liquor per week   Drug use: Not Currently   Sexual activity: Not on file  Other Topics Concern   Not on file  Social History Narrative   Not on file   Social Determinants of Health    Financial Resource Strain: Not on file  Food Insecurity: Not on file  Transportation Needs: Not on file  Physical Activity: Not on file  Stress: Not on file  Social Connections: Not on file  Intimate Partner Violence: Not on file    Review of Systems: See HPI, otherwise negative ROS  Physical Exam: BP (!) 174/115   Pulse (!) 106   Temp (!) 97.1 F (36.2 C) (Temporal)   Resp 18   Ht 6' (1.829 m)   Wt 68 kg   SpO2 100%   BMI 20.34 kg/m  General:   Alert,  pleasant and cooperative in NAD Head:  Normocephalic and atraumatic. Neck:  Supple; no masses or thyromegaly. Lungs:  Clear throughout to auscultation.    Heart:  Regular rate and rhythm. Abdomen:  Soft, nontender and nondistended. Normal bowel sounds, without guarding, and without rebound.   Neurologic:  Alert and  oriented x4;  grossly normal neurologically.  Impression/Plan: Mark Best is here for an colonoscopy to be performed for abnormal CT scan  Risks, benefits, limitations, and alternatives regarding  colonoscopy have been reviewed with the patient.  Questions have been answered.  All parties agreeable.   Lucilla Lame, MD  01/29/2022, 7:20 AM

## 2022-01-29 NOTE — Op Note (Signed)
[facility] Patient Name: Ayad Nieman Procedure Date: 01/29/2022 7:32 AM MRN: [id number] Account #: [id number] Date of Birth: 05-14-1956 Admit Type: Outpatient Age: 66 Room: [facility] ENDO ROOM 4 Gender: Male Note Status: Finalized Instrument Name: Jasper Riling [id number] Procedure:             Colonoscopy Indications:           Abnormal CT of the GI tract Providers:             Lucilla Lame MD, MD Referring MD:          No Local Md, MD (Referring MD) Medicines:             Propofol per Anesthesia Complications:         No immediate complications. Procedure:             Pre-Anesthesia Assessment:                        - Prior to the procedure, a History and Physical was                         performed, and patient medications and allergies were                         reviewed. The patient's tolerance of previous                         anesthesia was also reviewed. The risks and benefits                         of the procedure and the sedation options and risks                         were discussed with the patient. All questions were                         answered, and informed consent was obtained. Prior                         Anticoagulants: The patient has taken no previous                         anticoagulant or antiplatelet agents. ASA Grade                         Assessment: II - A patient with mild systemic disease.                         After reviewing the risks and benefits, the patient                         was deemed in satisfactory condition to undergo the                         procedure.                        After obtaining informed consent, the colonoscope was  passed under direct vision. Throughout the procedure,                         the patient's blood pressure, pulse, and oxygen                         saturations were monitored continuously. The                         Colonoscope was  introduced through the anus and                         advanced to the the cecum, identified by appendiceal                         orifice and ileocecal valve. Findings:      The perianal and digital rectal examinations were normal.      A 6 mm polyp was found in the transverse colon. The polyp was sessile.       The polyp was removed with a cold snare. Resection and retrieval were       complete.      Two sessile polyps were found in the sigmoid colon. The polyps were 2 to       5 mm in size. These polyps were removed with a cold snare. Resection and       retrieval were complete.      Non-bleeding internal hemorrhoids were found during retroflexion. The       hemorrhoids were Grade II (internal hemorrhoids that prolapse but reduce       spontaneously). Impression:            - One 6 mm polyp in the transverse colon, removed with                         a cold snare. Resected and retrieved.                        - Two 2 to 5 mm polyps in the sigmoid colon, removed                         with a cold snare. Resected and retrieved.                        - Non-bleeding internal hemorrhoids. Recommendation:        - Discharge patient to home.                        - Resume previous diet.                        - Continue present medications.                        - If the pathology report reveals adenomatous tissue,                         then repeat the colonoscopy for surveillance in 5  years.                        - Perform CT scan (computed tomography) of the abdomen                         with contrast. Procedure Code(s):     --- Professional ---                        [id number], Colonoscopy, flexible; with removal of                         tumor(s), polyp(s), or other lesion(s) by snare                         technique Diagnosis Code(s):     --- Professional ---                        R93.3, Abnormal findings on diagnostic imaging of                          other parts of digestive tract                        K63.5, Polyp of colon CPT copyright 2019 American Medical Association. All rights reserved. The codes documented in this report are preliminary and upon coder review may  be revised to meet current compliance requirements. Lucilla Lame MD, MD 01/29/2022 7:54:53 AM This report has been signed electronically. Number of Addenda: 0 Note Initiated On: 01/29/2022 7:32 AM Scope Withdrawal Time: 0 hours 5 minutes 15 seconds  Total Procedure Duration: 0 hours 7 minutes 20 seconds  Estimated Blood Loss:  Estimated blood loss: none.      [facility]

## 2022-01-29 NOTE — Transfer of Care (Signed)
Immediate Anesthesia Transfer of Care Note  Patient: Mark Best  Procedure(s) Performed: COLONOSCOPY WITH PROPOFOL  Patient Location: PACU  Anesthesia Type:General  Level of Consciousness: awake, alert  and oriented  Airway & Oxygen Therapy: Patient Spontanous Breathing  Post-op Assessment: Report given to RN and Post -op Vital signs reviewed and stable  Post vital signs: Reviewed and stable  Last Vitals:  Vitals Value Taken Time  BP    Temp    Pulse    Resp    SpO2      Last Pain:  Vitals:   01/29/22 0702  TempSrc: Temporal  PainSc: 0-No pain         Complications: No notable events documented.

## 2022-01-29 NOTE — Telephone Encounter (Signed)
This patient needs a repeat CT scan of the abd due to mass seen on previous imaging.

## 2022-01-29 NOTE — Anesthesia Preprocedure Evaluation (Signed)
Anesthesia Evaluation  Patient identified by MRN, date of birth, ID band Patient awake    Reviewed: Allergy & Precautions, NPO status , Patient's Chart, lab work & pertinent test results  History of Anesthesia Complications Negative for: history of anesthetic complications  Airway Mallampati: III  TM Distance: >3 FB Neck ROM: full    Dental  (+) Chipped, Poor Dentition, Missing   Pulmonary neg shortness of breath, COPD, Current Smoker,    Pulmonary exam normal        Cardiovascular Exercise Tolerance: Good (-) anginaNormal cardiovascular exam     Neuro/Psych negative neurological ROS  negative psych ROS   GI/Hepatic negative GI ROS, Neg liver ROS,   Endo/Other  negative endocrine ROS  Renal/GU negative Renal ROS  negative genitourinary   Musculoskeletal   Abdominal   Peds  Hematology negative hematology ROS (+)   Anesthesia Other Findings Past Medical History: No date: Arthritis No date: SOB (shortness of breath)  Past Surgical History: No date: COLONOSCOPY  BMI    Body Mass Index: 20.34 kg/m      Reproductive/Obstetrics negative OB ROS                             Anesthesia Physical Anesthesia Plan  ASA: 3  Anesthesia Plan: General   Post-op Pain Management:    Induction: Intravenous  PONV Risk Score and Plan: Propofol infusion and TIVA  Airway Management Planned: Natural Airway and Nasal Cannula  Additional Equipment:   Intra-op Plan:   Post-operative Plan:   Informed Consent: I have reviewed the patients History and Physical, chart, labs and discussed the procedure including the risks, benefits and alternatives for the proposed anesthesia with the patient or authorized representative who has indicated his/her understanding and acceptance.     Dental Advisory Given  Plan Discussed with: Anesthesiologist, CRNA and Surgeon  Anesthesia Plan Comments: (Patient  consented for risks of anesthesia including but not limited to:  - adverse reactions to medications - risk of airway placement if required - damage to eyes, teeth, lips or other oral mucosa - nerve damage due to positioning  - sore throat or hoarseness - Damage to heart, brain, nerves, lungs, other parts of body or loss of life  Patient voiced understanding.)        Anesthesia Quick Evaluation

## 2022-01-29 NOTE — Anesthesia Postprocedure Evaluation (Signed)
Anesthesia Post Note  Patient: ELVIE MAINES  Procedure(s) Performed: COLONOSCOPY WITH PROPOFOL  Patient location during evaluation: Endoscopy Anesthesia Type: General Level of consciousness: awake and alert Pain management: pain level controlled Vital Signs Assessment: post-procedure vital signs reviewed and stable Respiratory status: spontaneous breathing, nonlabored ventilation, respiratory function stable and patient connected to nasal cannula oxygen Cardiovascular status: blood pressure returned to baseline and stable Postop Assessment: no apparent nausea or vomiting Anesthetic complications: no   No notable events documented.   Last Vitals:  Vitals:   01/29/22 0810 01/29/22 0820  BP: (!) 150/101 (!) 163/98  Pulse: 84   Resp: 13   Temp:    SpO2: 100%     Last Pain:  Vitals:   01/29/22 0702  TempSrc: Temporal  PainSc: 0-No pain                 Precious Haws Izeah Vossler

## 2022-01-30 ENCOUNTER — Encounter: Payer: Self-pay | Admitting: Gastroenterology

## 2022-01-30 LAB — SURGICAL PATHOLOGY

## 2022-01-30 NOTE — Telephone Encounter (Signed)
CT has been scheduled at [facility]th Outpatient imaging [facility] [place], arrive at 2:45pm. Nothing to eat or drink 4 hours prior.   Called pt, no answer lmovm for pt to r/t my call

## 2022-01-30 NOTE — Addendum Note (Signed)
Addended by: Lurlean Nanny on: 01/30/2022 12:09 PM   Modules accepted: Orders

## 2022-02-02 NOTE — Telephone Encounter (Signed)
Left message on voicemail x 2

## 2022-02-03 NOTE — Telephone Encounter (Signed)
Called pt, no answer lmovm for pt to r/t my call

## 2022-02-05 NOTE — Telephone Encounter (Signed)
Pt called to state that he is unable to make appt for CT tomorrow as he does not have transportation.... number for central scheduling given to pt so he can call to r/s for a day that works for him

## 2022-02-05 NOTE — Telephone Encounter (Signed)
Left message on voicemail.

## 2022-02-05 NOTE — Telephone Encounter (Signed)
Left message on voicemail sister Kay--emergency contact

## 2022-02-06 ENCOUNTER — Ambulatory Visit: Admission: RE | Admit: 2022-02-06 | Payer: Medicare Other | Source: Ambulatory Visit

## 2022-02-17 ENCOUNTER — Ambulatory Visit
Admission: RE | Admit: 2022-02-17 | Discharge: 2022-02-17 | Disposition: A | Discharge status: Home / Self Care | Payer: Medicare Other | Source: Ambulatory Visit | Attending: Gastroenterology | Admitting: Gastroenterology

## 2022-02-17 DIAGNOSIS — K6389 Other specified diseases of intestine: Secondary | ICD-10-CM

## 2022-02-17 MED ORDER — IOHEXOL 300 MG/ML  SOLN
85.0000 mL | Freq: Once | INTRAMUSCULAR | Status: AC | PRN
  Administered 2022-02-17: 85 mL via INTRAVENOUS

## 2022-04-19 ENCOUNTER — Encounter: Payer: Self-pay | Admitting: Intensive Care

## 2022-04-19 ENCOUNTER — Emergency Department
Admission: EM | Admit: 2022-04-19 | Discharge: 2022-04-19 | Disposition: A | Discharge status: Home / Self Care | Payer: Medicare Other | Attending: Emergency Medicine | Admitting: Emergency Medicine

## 2022-04-19 ENCOUNTER — Other Ambulatory Visit: Payer: Self-pay

## 2022-04-19 ENCOUNTER — Emergency Department: Payer: Medicare Other

## 2022-04-19 DIAGNOSIS — R202 Paresthesia of skin: Secondary | ICD-10-CM | POA: Diagnosis present

## 2022-04-19 DIAGNOSIS — G621 Alcoholic polyneuropathy: Secondary | ICD-10-CM

## 2022-04-19 LAB — COMPREHENSIVE METABOLIC PANEL
ALT: 28 U/L (ref 0–44)
AST: 40 U/L (ref 15–41)
Albumin: 2.2 g/dL — ABNORMAL LOW (ref 3.5–5.0)
Alkaline Phosphatase: 102 U/L (ref 38–126)
Anion gap: 8 (ref 5–15)
BUN: 7 mg/dL — ABNORMAL LOW (ref 8–23)
CO2: 23 mmol/L (ref 22–32)
Calcium: 8.4 mg/dL — ABNORMAL LOW (ref 8.9–10.3)
Chloride: 114 mmol/L — ABNORMAL HIGH (ref 98–111)
Creatinine, Ser: 0.81 mg/dL (ref 0.61–1.24)
GFR, Estimated: >60 mL/min (ref >60)
Glucose, Bld: 94 mg/dL (ref 70–99)
Potassium: 3.5 mmol/L (ref 3.5–5.1)
Sodium: 145 mmol/L (ref 135–145)
Total Bilirubin: 0.4 mg/dL (ref 0.3–1.2)
Total Protein: 7.7 g/dL (ref 6.5–8.1)

## 2022-04-19 LAB — URINALYSIS, ROUTINE W REFLEX MICROSCOPIC
Bacteria, UA: NONE SEEN
Bilirubin Urine: NEGATIVE
Glucose, UA: NEGATIVE mg/dL
Hgb urine dipstick: NEGATIVE
Ketones, ur: NEGATIVE mg/dL
Leukocytes,Ua: NEGATIVE
Nitrite: NEGATIVE
Protein, ur: 100 mg/dL — AB
Specific Gravity, Urine: 1.027 (ref 1.005–1.030)
WBC, UA: NONE SEEN WBC/hpf (ref 0–5)
pH: 5 (ref 5.0–8.0)

## 2022-04-19 LAB — CBC
HCT: 34.1 % — ABNORMAL LOW (ref 39.0–52.0)
Hemoglobin: 11.4 g/dL — ABNORMAL LOW (ref 13.0–17.0)
MCH: 38.6 pg — ABNORMAL HIGH (ref 26.0–34.0)
MCHC: 33.4 g/dL (ref 30.0–36.0)
MCV: 115.6 fL — ABNORMAL HIGH (ref 80.0–100.0)
Platelets: 294 10*3/uL (ref 150–400)
RBC: 2.95 MIL/uL — ABNORMAL LOW (ref 4.22–5.81)
RDW: 14.9 % (ref 11.5–15.5)
WBC: 7.5 10*3/uL (ref 4.0–10.5)
nRBC: 0 % (ref 0.0–0.2)

## 2022-04-19 LAB — LIPASE, BLOOD: Lipase: 70 U/L — ABNORMAL HIGH (ref 11–51)

## 2022-04-19 LAB — TROPONIN I (HIGH SENSITIVITY)
Troponin I (High Sensitivity): 18 ng/L — ABNORMAL HIGH (ref <18)
Troponin I (High Sensitivity): 17 ng/L (ref <18)

## 2022-04-19 MED ORDER — THIAMINE HCL 100 MG PO TABS: 100.0000 mg | ORAL_TABLET | Freq: Every day | ORAL | 0 refills | Status: AC

## 2022-04-19 NOTE — Discharge Instructions (Addendum)
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  Please follow-up with [facility] for assistance with alcohol cessation when you are ready.  In the meantime please start taking the thiamine which is a B vitamin that is often low in people who drink alcohol chronically.  Continue to take your gabapentin as prescribed.

## 2022-04-19 NOTE — ED Triage Notes (Addendum)
Patient c/o numbness and pain in all extremities for months. Reports multiple falls the past two months. Unsure why he is falling. Denies urinary symptoms. Sister reports he has been going down hill since February. Also having stomach issues and unable to hardly eat.  Reports being told they found a spot on his liver that they think is a cyst.

## 2022-04-19 NOTE — ED Provider Notes (Signed)
[facility] Provider Note    Event Date/Time   First MD Initiated Contact with Patient 04/19/22 2258     (approximate)   History   Fall and Numbness   HPI  Mark Best is a 66 y.o. male   with no significant past medical history presents with numbness.  Patient notes that for several months he has had numbness tingling burning sensation in his hands and legs.  The lower extremity symptoms go from feet up to the hips.  It is quite painful for him denies any weakness also felt unsteady on his feet has had 2 falls recently.  Yesterday after he got out of bed in the middle of the night.  Is not exactly why he fell denies any loss of consciousness or syncope and did not hit his head.  Denies neck or new low back pain no bowel bladder incontinence.  Patient drinks heavily every day about 5-10 shots of liquor per night.  He was prescribed gabapentin by his primary care doctor.     Past Medical History:  Diagnosis Date   Arthritis    SOB (shortness of breath)     Patient Active Problem List   Diagnosis Date Noted   Abnormal CT scan, colon    Polyp of transverse colon      Physical Exam  Triage Vital Signs: ED Triage Vitals  Enc Vitals Group     BP 04/19/22 1753 (!) 130/102     Pulse Rate 04/19/22 1753 92     Resp 04/19/22 1753 18     Temp 04/19/22 1753 98.1 F (36.7 C)     Temp Source 04/19/22 1753 Oral     SpO2 04/19/22 1753 97 %     Weight 04/19/22 1759 144 lb (65.3 kg)     Height 04/19/22 1759 6' (1.829 m)     Head Circumference --      Peak Flow --      Pain Score 04/19/22 1759 9     Pain Loc --      Pain Edu? --      Excl. in [place]? --     Most recent vital signs: Vitals:   04/19/22 2256 04/19/22 2258  BP: (!) 159/93   Pulse:  98  Resp:    Temp:    SpO2:  100%     General: Awake, no distress.  Patient is thin chronically ill-appearing CV:  Good peripheral perfusion.  Resp:  Normal effort.  Abd:  No distention.  Neuro:              Awake, Alert, Oriented x 3  Other:  Aox3, nml speech  PERRL, EOMI, face symmetric, nml tongue movement  5/5 strength in the BL upper and lower extremities  Sensation grossly intact in the BL upper and lower extremities  Finger-nose-finger intact BL Patient is tremulous Able to ambulate with no ataxia     ED Results / Procedures / Treatments  Labs (all labs ordered are listed, but only abnormal results are displayed) Labs Reviewed  CBC - Abnormal; Notable for the following components:      Result Value   RBC 2.95 (*)    Hemoglobin 11.4 (*)    HCT 34.1 (*)    MCV 115.6 (*)    MCH 38.6 (*)    All other components within normal limits  URINALYSIS, ROUTINE W REFLEX MICROSCOPIC - Abnormal; Notable for the following components:   Color, Urine YELLOW (*)  APPearance HAZY (*)    Protein, ur 100 (*)    All other components within normal limits  LIPASE, BLOOD - Abnormal; Notable for the following components:   Lipase 70 (*)    All other components within normal limits  COMPREHENSIVE METABOLIC PANEL - Abnormal; Notable for the following components:   Chloride 114 (*)    BUN 7 (*)    Calcium 8.4 (*)    Albumin 2.2 (*)    All other components within normal limits  TROPONIN I (HIGH SENSITIVITY) - Abnormal; Notable for the following components:   Troponin I (High Sensitivity) 18 (*)    All other components within normal limits  TROPONIN I (HIGH SENSITIVITY)     EKG  First EKG has significant artifact limiting my ability to interpret it  Repeat EKG shows a 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   MEDICATIONS ORDERED IN ED: Medications - No data to display   IMPRESSION / MDM / [place] / ED COURSE  I reviewed the triage vital signs and the nursing notes.                              Patient's presentation is most consistent with acute complicated illness / injury requiring diagnostic workup.  Differential diagnosis includes, but is not limited to, peripheral neuropathy secondary to chronic alcohol use, diabetes, less likely Guillan Barr syndrome, chronic demyelinating disorder, Warnicke's encephalopathy  The patient is 66 year old male who drinks alcohol chronically presents with burning numbness tingling in his bilateral upper and lower extremities.  Presentation is consistent with peripheral neuropathy I suspect due to his chronic alcohol use disorder.  On exam he has no weakness and subjective sensation is normal he has no ataxia no dysmetria normal extraocular movements.  Patient is somewhat tremulous.  Last drink was last night because he has been in the ER for about 5 hours waiting to be seen.  He is mildly hypertensive but vitals are otherwise reassuring.  Labs reviewed anemia with macrocytosis.  CMP with normal renal function and electrolytes.  CT head was obtained given the fall which is negative for acute subdural or other acute process.  Had a long discussion with the patient regarding peripheral neuropathy likely due to his chronic alcohol use disorder.  We discussed continue gabapentin will start on thiamine.  Strongly encourage patient to consider alcohol cessation.  Is not ready to do this yet.  Will refer to [place].      FINAL CLINICAL IMPRESSION(S) / ED DIAGNOSES   Final diagnoses:  None     Rx / DC Orders   ED Discharge Orders     None        Note:  This document was prepared using Dragon voice recognition software and may include unintentional dictation errors.   Rada Hay, MD 04/19/22 [phone]

## 2023-03-15 ENCOUNTER — Observation Stay: Payer: Medicare Other

## 2023-03-15 ENCOUNTER — Inpatient Hospital Stay
Admission: EM | Admit: 2023-03-15 | Discharge: 2023-03-18 | DRG: 177 | Disposition: A | Discharge status: Hospice | Payer: Medicare Other | Attending: Student | Admitting: Student

## 2023-03-15 ENCOUNTER — Emergency Department: Payer: Medicare Other

## 2023-03-15 ENCOUNTER — Other Ambulatory Visit: Payer: Self-pay

## 2023-03-15 DIAGNOSIS — K760 Fatty (change of) liver, not elsewhere classified: Secondary | ICD-10-CM | POA: Diagnosis present

## 2023-03-15 DIAGNOSIS — D638 Anemia in other chronic diseases classified elsewhere: Secondary | ICD-10-CM | POA: Diagnosis present

## 2023-03-15 DIAGNOSIS — Z79899 Other long term (current) drug therapy: Secondary | ICD-10-CM

## 2023-03-15 DIAGNOSIS — G9340 Encephalopathy, unspecified: Secondary | ICD-10-CM

## 2023-03-15 DIAGNOSIS — D649 Anemia, unspecified: Secondary | ICD-10-CM

## 2023-03-15 DIAGNOSIS — A419 Sepsis, unspecified organism: Secondary | ICD-10-CM

## 2023-03-15 DIAGNOSIS — R791 Abnormal coagulation profile: Secondary | ICD-10-CM | POA: Diagnosis present

## 2023-03-15 DIAGNOSIS — I959 Hypotension, unspecified: Secondary | ICD-10-CM | POA: Diagnosis present

## 2023-03-15 DIAGNOSIS — E46 Unspecified protein-calorie malnutrition: Secondary | ICD-10-CM | POA: Diagnosis present

## 2023-03-15 DIAGNOSIS — Z881 Allergy status to other antibiotic agents status: Secondary | ICD-10-CM

## 2023-03-15 DIAGNOSIS — J90 Pleural effusion, not elsewhere classified: Secondary | ICD-10-CM

## 2023-03-15 DIAGNOSIS — R7989 Other specified abnormal findings of blood chemistry: Secondary | ICD-10-CM

## 2023-03-15 DIAGNOSIS — E861 Hypovolemia: Secondary | ICD-10-CM | POA: Diagnosis present

## 2023-03-15 DIAGNOSIS — F1090 Alcohol use, unspecified, uncomplicated: Secondary | ICD-10-CM

## 2023-03-15 DIAGNOSIS — E860 Dehydration: Secondary | ICD-10-CM | POA: Diagnosis present

## 2023-03-15 DIAGNOSIS — J189 Pneumonia, unspecified organism: Secondary | ICD-10-CM

## 2023-03-15 DIAGNOSIS — R911 Solitary pulmonary nodule: Secondary | ICD-10-CM | POA: Diagnosis present

## 2023-03-15 DIAGNOSIS — L89012 Pressure ulcer of right elbow, stage 2: Secondary | ICD-10-CM | POA: Diagnosis present

## 2023-03-15 DIAGNOSIS — L89022 Pressure ulcer of left elbow, stage 2: Secondary | ICD-10-CM | POA: Diagnosis present

## 2023-03-15 DIAGNOSIS — I2489 Other forms of acute ischemic heart disease: Secondary | ICD-10-CM

## 2023-03-15 DIAGNOSIS — Z66 Do not resuscitate: Secondary | ICD-10-CM | POA: Diagnosis present

## 2023-03-15 DIAGNOSIS — G629 Polyneuropathy, unspecified: Secondary | ICD-10-CM | POA: Diagnosis present

## 2023-03-15 DIAGNOSIS — D539 Nutritional anemia, unspecified: Secondary | ICD-10-CM | POA: Diagnosis present

## 2023-03-15 DIAGNOSIS — E872 Acidosis, unspecified: Secondary | ICD-10-CM | POA: Diagnosis present

## 2023-03-15 DIAGNOSIS — R54 Age-related physical debility: Secondary | ICD-10-CM | POA: Diagnosis present

## 2023-03-15 DIAGNOSIS — Z1152 Encounter for screening for COVID-19: Secondary | ICD-10-CM

## 2023-03-15 DIAGNOSIS — R4182 Altered mental status, unspecified: Principal | ICD-10-CM

## 2023-03-15 DIAGNOSIS — Z681 Body mass index (BMI) 19 or less, adult: Secondary | ICD-10-CM

## 2023-03-15 DIAGNOSIS — R946 Abnormal results of thyroid function studies: Secondary | ICD-10-CM | POA: Diagnosis present

## 2023-03-15 DIAGNOSIS — N179 Acute kidney failure, unspecified: Secondary | ICD-10-CM

## 2023-03-15 DIAGNOSIS — I700 Atherosclerosis of aorta: Secondary | ICD-10-CM | POA: Diagnosis present

## 2023-03-15 DIAGNOSIS — R64 Cachexia: Secondary | ICD-10-CM | POA: Diagnosis present

## 2023-03-15 DIAGNOSIS — Z515 Encounter for palliative care: Secondary | ICD-10-CM

## 2023-03-15 DIAGNOSIS — J690 Pneumonitis due to inhalation of food and vomit: Secondary | ICD-10-CM | POA: Diagnosis not present

## 2023-03-15 DIAGNOSIS — K7031 Alcoholic cirrhosis of liver with ascites: Secondary | ICD-10-CM | POA: Diagnosis present

## 2023-03-15 DIAGNOSIS — F1010 Alcohol abuse, uncomplicated: Secondary | ICD-10-CM | POA: Diagnosis present

## 2023-03-15 DIAGNOSIS — J918 Pleural effusion in other conditions classified elsewhere: Secondary | ICD-10-CM | POA: Diagnosis present

## 2023-03-15 DIAGNOSIS — G9341 Metabolic encephalopathy: Secondary | ICD-10-CM | POA: Diagnosis present

## 2023-03-15 DIAGNOSIS — F17210 Nicotine dependence, cigarettes, uncomplicated: Secondary | ICD-10-CM | POA: Diagnosis present

## 2023-03-15 LAB — CBC WITH DIFFERENTIAL/PLATELET
Abs Immature Granulocytes: 0.12 10*3/uL — ABNORMAL HIGH (ref 0.00–0.07)
Basophils Absolute: 0 10*3/uL (ref 0.0–0.1)
Basophils Relative: 0 %
Eosinophils Absolute: 0 10*3/uL (ref 0.0–0.5)
Eosinophils Relative: 0 %
HCT: 26.2 % — ABNORMAL LOW (ref 39.0–52.0)
Hemoglobin: 8.9 g/dL — ABNORMAL LOW (ref 13.0–17.0)
Immature Granulocytes: 1 %
Lymphocytes Relative: 9 %
Lymphs Abs: 1.3 10*3/uL (ref 0.7–4.0)
MCH: 33.8 pg (ref 26.0–34.0)
MCHC: 34 g/dL (ref 30.0–36.0)
MCV: 99.6 fL (ref 80.0–100.0)
Monocytes Absolute: 0.7 10*3/uL (ref 0.1–1.0)
Monocytes Relative: 5 %
Neutro Abs: 12.1 10*3/uL — ABNORMAL HIGH (ref 1.7–7.7)
Neutrophils Relative %: 85 %
Platelets: 164 10*3/uL (ref 150–400)
RBC: 2.63 MIL/uL — ABNORMAL LOW (ref 4.22–5.81)
RDW: 23.4 % — ABNORMAL HIGH (ref 11.5–15.5)
Smear Review: NORMAL
WBC: 14.2 10*3/uL — ABNORMAL HIGH (ref 4.0–10.5)
nRBC: 0.3 % — ABNORMAL HIGH (ref 0.0–0.2)

## 2023-03-15 LAB — RETICULOCYTES
Immature Retic Fract: 37.8 % — ABNORMAL HIGH (ref 2.3–15.9)
RBC.: 2.19 MIL/uL — ABNORMAL LOW (ref 4.22–5.81)
Retic Count, Absolute: 72.9 10*3/uL (ref 19.0–186.0)
Retic Ct Pct: 3.3 % — ABNORMAL HIGH (ref 0.4–3.1)

## 2023-03-15 LAB — COMPREHENSIVE METABOLIC PANEL
ALT: 31 U/L (ref 0–44)
AST: 91 U/L — ABNORMAL HIGH (ref 15–41)
Albumin: <1.5 g/dL — ABNORMAL LOW (ref 3.5–5.0)
Alkaline Phosphatase: 126 U/L (ref 38–126)
Anion gap: 12 (ref 5–15)
BUN: 29 mg/dL — ABNORMAL HIGH (ref 8–23)
CO2: 19 mmol/L — ABNORMAL LOW (ref 22–32)
Calcium: 8 mg/dL — ABNORMAL LOW (ref 8.9–10.3)
Chloride: 107 mmol/L (ref 98–111)
Creatinine, Ser: 2.35 mg/dL — ABNORMAL HIGH (ref 0.61–1.24)
GFR, Estimated: 30 mL/min — ABNORMAL LOW (ref >60)
Glucose, Bld: 135 mg/dL — ABNORMAL HIGH (ref 70–99)
Potassium: 4.7 mmol/L (ref 3.5–5.1)
Sodium: 138 mmol/L (ref 135–145)
Total Bilirubin: 1.2 mg/dL (ref 0.3–1.2)
Total Protein: 6.8 g/dL (ref 6.5–8.1)

## 2023-03-15 LAB — PHOSPHORUS: Phosphorus: 3.5 mg/dL (ref 2.5–4.6)

## 2023-03-15 LAB — TROPONIN I (HIGH SENSITIVITY)
Troponin I (High Sensitivity): 39 ng/L — ABNORMAL HIGH (ref <18)
Troponin I (High Sensitivity): 49 ng/L — ABNORMAL HIGH (ref <18)

## 2023-03-15 LAB — ETHANOL: Alcohol, Ethyl (B): <10 mg/dL (ref <10)

## 2023-03-15 LAB — IRON AND TIBC: Iron: 51 ug/dL (ref 45–182)

## 2023-03-15 LAB — MAGNESIUM: Magnesium: 2.2 mg/dL (ref 1.7–2.4)

## 2023-03-15 LAB — LIPASE, BLOOD: Lipase: 27 U/L (ref 11–51)

## 2023-03-15 LAB — FOLATE: Folate: 17.4 ng/mL (ref >5.9)

## 2023-03-15 LAB — LACTIC ACID, PLASMA
Lactic Acid, Venous: 3.6 mmol/L (ref 0.5–1.9)
Lactic Acid, Venous: 3.2 mmol/L (ref 0.5–1.9)

## 2023-03-15 LAB — CK: Total CK: 138 U/L (ref 49–397)

## 2023-03-15 LAB — SARS CORONAVIRUS 2 BY RT PCR: SARS Coronavirus 2 by RT PCR: NEGATIVE

## 2023-03-15 MED ORDER — SODIUM CHLORIDE 0.9% FLUSH
3.0000 mL | Freq: Two times a day (BID) | INTRAVENOUS | Status: DC
  Administered 2023-03-15 – 2023-03-18 (×7): 3 mL via INTRAVENOUS

## 2023-03-15 MED ORDER — LACTATED RINGERS IV BOLUS
1000.0000 mL | Freq: Once | INTRAVENOUS | Status: AC
  Administered 2023-03-15: 1000 mL via INTRAVENOUS

## 2023-03-15 MED ORDER — SODIUM CHLORIDE 0.9 % IV SOLN
3.0000 g | Freq: Two times a day (BID) | INTRAVENOUS | Status: DC
  Administered 2023-03-16 – 2023-03-17 (×2): 3 g via INTRAVENOUS
  Filled 2023-03-15 (×2): qty 8

## 2023-03-15 MED ORDER — SODIUM CHLORIDE 0.9 % IV SOLN
2.0000 g | Freq: Once | INTRAVENOUS | Status: AC
  Administered 2023-03-15: 2 g via INTRAVENOUS
  Filled 2023-03-15: qty 12.5

## 2023-03-15 MED ORDER — ACETAMINOPHEN 650 MG RE SUPP: 650.0000 mg | Freq: Four times a day (QID) | RECTAL | Status: DC | PRN

## 2023-03-15 MED ORDER — THIAMINE HCL 100 MG/ML IJ SOLN
100.0000 mg | Freq: Every day | INTRAMUSCULAR | Status: DC
  Administered 2023-03-15 – 2023-03-17 (×3): 100 mg via INTRAVENOUS
  Filled 2023-03-15 (×3): qty 2

## 2023-03-15 MED ORDER — ADULT MULTIVITAMIN W/MINERALS CH: 1.0000 | ORAL_TABLET | Freq: Every day | ORAL | Status: DC

## 2023-03-15 MED ORDER — LACTATED RINGERS IV BOLUS (SEPSIS)
1000.0000 mL | Freq: Once | INTRAVENOUS | Status: AC
  Administered 2023-03-15: 1000 mL via INTRAVENOUS

## 2023-03-15 MED ORDER — SODIUM CHLORIDE 0.9 % IV SOLN
1.0000 mg | Freq: Every day | INTRAVENOUS | Status: DC
  Administered 2023-03-15 – 2023-03-17 (×3): 1 mg via INTRAVENOUS
  Filled 2023-03-15 (×4): qty 0.2

## 2023-03-15 MED ORDER — ONDANSETRON HCL 4 MG/2ML IJ SOLN: 4.0000 mg | Freq: Four times a day (QID) | INTRAMUSCULAR | Status: DC | PRN

## 2023-03-15 MED ORDER — ACETAMINOPHEN 325 MG PO TABS: 650.0000 mg | ORAL_TABLET | Freq: Four times a day (QID) | ORAL | Status: DC | PRN

## 2023-03-15 MED ORDER — LORAZEPAM 2 MG/ML IJ SOLN
1.0000 mg | INTRAMUSCULAR | Status: AC | PRN
  Administered 2023-03-15: 2 mg via INTRAVENOUS
  Filled 2023-03-15: qty 1

## 2023-03-15 MED ORDER — THIAMINE MONONITRATE 100 MG PO TABS: 100.0000 mg | ORAL_TABLET | Freq: Every day | ORAL | Status: DC

## 2023-03-15 MED ORDER — VANCOMYCIN HCL IN DEXTROSE 1-5 GM/200ML-% IV SOLN
1000.0000 mg | Freq: Once | INTRAVENOUS | Status: AC
  Administered 2023-03-15: 1000 mg via INTRAVENOUS
  Filled 2023-03-15: qty 200

## 2023-03-15 MED ORDER — LORAZEPAM 1 MG PO TABS: 1.0000 mg | ORAL_TABLET | ORAL | Status: AC | PRN

## 2023-03-15 MED ORDER — ALBUMIN HUMAN 5 % IV SOLN
12.5000 g | Freq: Four times a day (QID) | INTRAVENOUS | Status: AC
  Administered 2023-03-15 – 2023-03-16 (×3): 12.5 g via INTRAVENOUS
  Filled 2023-03-15 (×4): qty 250

## 2023-03-15 MED ORDER — ONDANSETRON HCL 4 MG PO TABS: 4.0000 mg | ORAL_TABLET | Freq: Four times a day (QID) | ORAL | Status: DC | PRN

## 2023-03-15 NOTE — Assessment & Plan Note (Signed)
History of severe alcohol use disorder with no prior admissions or ED visits for withdrawal.  He is at high risk given alcohol levels negative  - Seizure precautions - CIWA monitoring with as needed Ativan - Daily folic acid and thiamine - TOC consultation for resources

## 2023-03-15 NOTE — H&P (Addendum)
History and Physical    Patient: Mark Best:[id number] DOB: July 23, 1956 DOA: 03/15/2023 DOS: the patient was seen and examined on 03/15/2023 PCP: [facility]  Patient coming from: Home  Chief Complaint:  Chief Complaint  Patient presents with   Fall   Altered Mental Status   HPI: Mark Best is a 67 y.o. 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  History limited due to patient's altered mental status.  History obtained through chart review and from patient's sisters at bedside.  Lupita Leash and Joyce Gross states that Mark Best has had alcohol use issues for at least 20 years now, stating he has been drinking since he was a teenager however over the last 5 to 6 years, his alcohol use has been very heavy since he retired.  They note that they last saw him 6 days ago at which time he endorsed having some diarrhea and feeling fatigued, but no other symptoms were expressed.  They tried calling him all day yesterday and he did not answer and due to this, they came to check on him today.  They found him down on the ground.  They noted that the fridge was open and all of food was spilled out on the ground and warm.  They know that he has been bruising very easily for a while now.  He does not have any known history of cirrhosis.  No prior history of alcohol withdrawal or withdrawal seizures.  ED course: On arrival to the ED, patient was normotensive at 122/83 with heart rate of 85.  He was saturating at 97% on room air.  He was afebrile at 97.7.  His respiratory rate was within normal limits at 15. Initial workup demonstrated WBC of 14.2, hemoglobin of 8.9, potassium of 4.7, bicarb 19 with anion gap of 12, BUN 29, creatinine 2.35, albumin less than 1.5 with AST of 91 and GFR of 30.  Troponin elevated at 39.  CK within normal limits at 138.  Lactic acid elevated at 3.6.   Chest x-ray was obtained that demonstrated moderate layering right pleural effusion with associated atelectasis versus consolidation.  CT of the head demonstrated no acute abnormalities.  Patient started on vancomycin and cefepime due to concern for sepsis.  [facility] contacted for admission.  Review of Systems: unable to review all systems due to the inability of the patient to answer questions.  Past Medical History:  Diagnosis Date   Arthritis    SOB (shortness of breath)    Past Surgical History:  Procedure Laterality Date   COLONOSCOPY     COLONOSCOPY WITH PROPOFOL N/A 01/29/2022   Procedure: COLONOSCOPY WITH PROPOFOL;  Surgeon: Midge Minium, MD;  Location: [facility] ENDOSCOPY;  Service: Endoscopy;  Laterality: N/A;   Social History:  reports that he has been smoking cigarettes. He has never used smokeless tobacco. He reports that he does not currently use alcohol after a past usage of about 56.0 standard drinks of alcohol per week. He reports that he does not currently use drugs.  Allergies  Allergen Reactions   Azithromycin Rash    No family history on file.  Prior to Admission medications   Medication Sig Start Date End Date Taking? Authorizing Provider  folic acid (FOLVITE) 1 MG tablet Take 1 mg by mouth daily. Patient not taking: Reported on 03/15/2023 01/05/22   [provider]  promethazine (PHENERGAN) 25 MG tablet Take  25 mg by mouth every 4 (four) hours as needed. Patient not taking: Reported on 03/15/2023 01/01/22   [provider]  traZODone (DESYREL) 50 MG tablet PLEASE SEE ATTACHED FOR DETAILED DIRECTIONS Patient not taking: Reported on 03/15/2023 01/01/22   [provider]    Physical Exam: Vitals:   03/15/23 1240 03/15/23 1500 03/15/23 1658 03/15/23 1700  BP:  103/76 101/81   Pulse:  86 81   Resp:  14 15   Temp:   97.8 F (36.6 C)   TempSrc:   Axillary   SpO2:  97% 96%   Weight: 59.4 kg   57.4 kg  Height:    5\' 11"  (1.803 m)   Physical  Exam Vitals and nursing note reviewed.  Constitutional:      Appearance: He is ill-appearing.     Comments: Alert, but appears both acutely and chronically ill. Cathectic. Not cooperative and unable to answer questions.   HENT:     Head: Normocephalic and atraumatic.     Mouth/Throat:     Pharynx: Oropharynx is clear.     Comments: Poor dentition. No tongue lesions or blood in the oropharynx. Eyes:     Conjunctiva/sclera: Conjunctivae normal.     Pupils: Pupils are equal, round, and reactive to light.  Cardiovascular:     Rate and Rhythm: Normal rate and regular rhythm.     Heart sounds: No murmur heard. Pulmonary:     Effort: Pulmonary effort is normal. No respiratory distress.     Comments: Diminished breathe sound on the right. No abnormal breathe sounds on the left,. Abdominal:     General: Bowel sounds are normal. There is distension (mild).     Palpations: Abdomen is soft.     Tenderness: There is no abdominal tenderness. There is no guarding.  Musculoskeletal:     Right lower leg: No edema.     Left lower leg: No edema.  Skin:    General: Skin is warm.     Coloration: Skin is not jaundiced.     Findings: Bruising present.     Comments: Very dry skin.  Bilateral upper extremity bruises and petechiae with a few skin tears that have some serosanguineous drainage.  No evidence of infection though.  Neurological:     Mental Status: He is disoriented.     Cranial Nerves: No facial asymmetry.     Comments:  Patient is alert, looking around room and able to move all extremities against gravity, however not answering questions or following commands. Appears grossly weak throughout Bilateral upper extremity tremor noted but unable to elicit asterixis    Data Reviewed: CBC with WBC of 14.2, hemoglobin 8.9, MCV 99, platelets 164 CMP with sodium of 138, potassium 4.7, bicarb 19, glucose 135, BUN 29, creatinine 2.35, calcium 8.0, anion gap 12, albumin less than 1.5, AST 91, ALT 31,  GFR 30 Troponin 39 Lactic acid 3.6 CK1 38 Lipase 27 Alcohol level negative  EKG personally reviewed.  Quite a bit of motion artifact, however narrow complex irregular rhythm most consistent with sinus rhythm.  Rate of 90.  QTc prolonged at 495.    CT CHEST WO CONTRAST  Result Date: 03/15/2023 CLINICAL DATA:  Pleural effusion. Nondiagnostic x-ray. Fall. Altered mental status. EXAM: CT CHEST WITHOUT CONTRAST TECHNIQUE: Multidetector CT imaging of the chest was performed following the standard protocol without IV contrast. RADIATION DOSE REDUCTION: This exam was performed according to the departmental dose-optimization program which includes automated exposure control, adjustment of the  mA and/or kV according to patient size and/or use of iterative reconstruction technique. COMPARISON:  Chest x-ray 03/15/2023, CT of the chest abdomen and pelvis on 01/14/2022 FINDINGS: Cardiovascular: Heart size is normal. There is no pericardial effusion. There is coronary artery calcification. The thoracic aorta is not aneurysmal but is partially calcified. Normal appearance of the noncontrast pulmonary arteries. Mediastinum/Nodes: The visualized portion of the thyroid gland has a normal appearance. Esophagus is unremarkable. No significant mediastinal, hilar, or axillary adenopathy. Lungs/Pleura: Moderate RIGHT pleural effusion. There is consolidation of the RIGHT LOWER lobe. Airways are patent. There is a 4 millimeter nodule within the superior segment of the LEFT LOWER lobe. Upper Abdomen: There is marked fatty infiltration of the liver. Oval mass adjacent to the gallbladder fossa is 2.2 centimeters (image 154 of series 2). There is a 2.0 centimeter hyperdensity adjacent to the falciform ligament, possibly related to heterogeneous fatty infiltration (image 158 of series 2). Lesion is not excluded. Gallbladder is distended. Perihepatic ascites. Musculoskeletal: Moderate degenerative changes throughout the thoracic spine.  No acute fractures. IMPRESSION: 1. Moderate RIGHT pleural effusion. 2. Consolidation of the RIGHT LOWER lobe. 3. A 4 millimeter nodule in the LEFT LOWER lobe. Solid pulmonary nodule measuring 4 mm. Per Fleischner Society Guidelines, no routine follow-up imaging is recommended. These guidelines do not apply to immunocompromised patients and patients with cancer. Follow up in patients with significant comorbidities as clinically warranted. For lung cancer screening, adhere to Lung-RADS guidelines. Reference: Radiology. 2017; 284(1):228-43. 4. Severe hepatic steatosis. 5. 2.2 centimeter hypodense lesion and 2.1 centimeter hyperdense lesion adjacent to the falciform ligament. Recommend further evaluation with MRI of the abdomen with and without contrast. 6. Distended gallbladder. 7. Ascites. 8.  Aortic Atherosclerosis (ICD10-I70.0). Electronically Signed   By: Norva Pavlov M.D.   On: 03/15/2023 16:48   CT Head Wo Contrast  Result Date: 03/15/2023 CLINICAL DATA:  Altered mental status EXAM: CT HEAD WITHOUT CONTRAST TECHNIQUE: Contiguous axial images were obtained from the base of the skull through the vertex without intravenous contrast. RADIATION DOSE REDUCTION: This exam was performed according to the departmental dose-optimization program which includes automated exposure control, adjustment of the mA and/or kV according to patient size and/or use of iterative reconstruction technique. COMPARISON:  04/19/2022 FINDINGS: There is patient motion in many of the images. Additional images were repeated. Brain: No acute intracranial findings are seen. There are no signs of bleeding within the cranium. Cortical sulci are prominent. There is decreased density in periventricular and subcortical white matter. Vascular: Unremarkable. Skull: No displaced fracture is seen. Sinuses/Orbits: There is mucosal thickening in sphenoid sinus. Other: None. IMPRESSION: Motion limited study. No acute intracranial findings are seen.  Atrophy. Small-vessel disease. Mild chronic sphenoid sinusitis. Electronically Signed   By: Ernie Avena M.D.   On: 03/15/2023 14:28   DG Chest Port 1 View  Result Date: 03/15/2023 CLINICAL DATA:  Weakness EXAM: PORTABLE CHEST 1 VIEW COMPARISON:  12/06/2012 FINDINGS: Rotated AP portable examination. Heart size is normal. Moderate, layering right pleural effusion and associated atelectasis or consolidation. Left lung normally aerated. Osseous structures unremarkable. IMPRESSION: Moderate, layering right pleural effusion and associated atelectasis or consolidation. Left lung normally aerated. Electronically Signed   By: Jearld Lesch M.D.   On: 03/15/2023 13:56    Results are pending, will review when available.  Assessment and Plan:  Aspiration pneumonia [facility]) Patient presenting with moderate to large pleural effusion with associated consolidation on chest x-ray consistent with aspiration pneumonia given alcohol use disorder and being found  down.  Patient does have an elevated WBC and lactic acid, however no other criteria to meet sepsis or SIRS, given his vital signs are quite normal.  - CT of the chest for further characterization of effusion - Discontinue vancomycin and cefepime - Start Unasyn - Continuous pulse oximetry - Supplemental oxygen as needed to maintain oxygen saturation above 88%  Parapneumonic effusion I suspect patient will likely need a chest tube given size of effusion.  - CT of the chest for further characterization - IR consulted for thoracentesis  Acute encephalopathy Patient is present with disorientation, not answering questions or following commands.  Likely multi factorial in the setting of pneumonia, potential underlying cirrhosis, and alcohol withdrawal.  No focal abnormalities to suggest CVA or TIA  - No indication at this time for further brain imaging  Alcohol use disorder History of severe alcohol use disorder with no prior admissions or ED visits  for withdrawal.  He is at high risk given alcohol levels negative  - Seizure precautions - CIWA monitoring with as needed Ativan - Daily folic acid and thiamine - TOC consultation for resources  Elevated LFTs Elevated AST with evidence of decreased albumin concerning for underlying cirrhosis.  Physical exam notable for facial telangiectasias, abdominal distention.   - Repeat CMP in the a.m. - PT/INR - Hepatic ultrasound with evaluation for ascites as well  AKI (acute kidney injury) (HCC) I suspect this is multifactorial in the setting of profound hypovolemia with high daily alcohol use and unknown downtime, now with also underlying pneumonia.  - Aggressive IV fluid resuscitation - Repeat BMP in the a.m. - Strict in and out - Avoid nephrotoxic agents - Evaluate for retention with ultrasound  Normocytic anemia Hemoglobin of 8.9 with no evidence of bleeding on examination other than significant bruising all over.  I suspect this may be due to his alcohol use disorder with associated malnutrition and bone marrow suppression.  - Daily folic acid and thiamine - Iron panel, B12, folic acid levels pending - Transfuse for hemoglobin less than 7 - Repeat CBC in the a.m.  Demand ischemia Initial troponin elevated at 39 with no reported chest pain or previous history of cardiac disease.  Will continue to trend, however I suspect this is demand in the setting of pneumonia, profound hypovolemia, AKI.  - Repeat troponin pending  Advance Care Planning:   Code Status: DNR/DNI.  Patient is unable to make medical decisions for himself.  He has his 2 sisters at bedside, they state he has informed him of where to find his advanced directive at home but they do not have it with him.  They note that they do not believe Mr. Walen would ever want to receive cardiac or pulmonary resuscitation  Consults: None  Family Communication: Patient's sisters Lupita Leash and Joyce Gross updated at bedside  Severity of  Illness: The appropriate patient status for this patient is INPATIENT. Inpatient status is judged to be reasonable and necessary in order to provide the required intensity of service to ensure the patient's safety. The patient's presenting symptoms, physical exam findings, and initial radiographic and laboratory data in the context of their chronic comorbidities is felt to place them at high risk for further clinical deterioration. Furthermore, it is not anticipated that the patient will be medically stable for discharge from the hospital within 2 midnights of admission.   * I certify that at the point of admission it is my clinical judgment that the patient will require inpatient hospital care spanning beyond  2 midnights from the point of admission due to high intensity of service, high risk for further deterioration and high frequency of surveillance required.*  Author: Verdene Lennert, MD 03/15/2023 7:50 PM  For on call review [email or web address].

## 2023-03-15 NOTE — ED Provider Notes (Signed)
[facility] Provider Note    Event Date/Time   First MD Initiated Contact with Patient 03/15/23 1225     (approximate)   History   Fall and Altered Mental Status   HPI  Mark Best is a 67 y.o. male with a history of alcohol abuse who presents with lysed weakness and altered mental status.  The patient's sisters are the primary historians.  They state that they found him at his house today on the ground and altered.  He normally lives alone and is able to take care of himself although they state he has chronic neuropathy so some difficulty with walking.  He is unable to give any history.  He was last seen 6 days ago.  I reviewed the past medical records per the patient's most recent outpatient encounter was with the [facility] walk-in clinic in March of last year for low back pain.  He was seen in the ED in August for paresthesias.  He has no recent hospitalizations.   Physical Exam   Triage Vital Signs: ED Triage Vitals  Encounter Vitals Group     BP 03/15/23 1233 122/83     Systolic BP Percentile --      Diastolic BP Percentile --      Pulse Rate 03/15/23 1233 85     Resp 03/15/23 1233 15     Temp 03/15/23 1233 97.7 F (36.5 C)     Temp Source 03/15/23 1233 Oral     SpO2 03/15/23 1233 97 %     Weight 03/15/23 1240 131 lb (59.4 kg)     Height --      Head Circumference --      Peak Flow --      Pain Score --      Pain Loc --      Pain Education --      Exclude from Growth Chart --     Most recent vital signs: Vitals:   03/15/23 1233 03/15/23 1500  BP: 122/83 103/76  Pulse: 85 86  Resp: 15 14  Temp: 97.7 F (36.5 C)   SpO2: 97% 97%     General: Awake, weak and confused appearing, frail. CV:  Good peripheral perfusion.  Resp:  Normal effort.  CTAB. Abd:  No distention.  Other:  No jaundice or scleral icterus.  Pale appearing skin.  Motor intact in all extremities.  EOMI.  PERRLA.  No facial droop.  Very dry mucous membranes.   Following commands, able to state his first name but otherwise not answering questions.   ED Results / Procedures / Treatments   Labs (all labs ordered are listed, but only abnormal results are displayed) Labs Reviewed  COMPREHENSIVE METABOLIC PANEL - Abnormal; Notable for the following components:      Result Value   CO2 19 (*)    Glucose, Bld 135 (*)    BUN 29 (*)    Creatinine, Ser 2.35 (*)    Calcium 8.0 (*)    Albumin <1.5 (*)    AST 91 (*)    GFR, Estimated 30 (*)    All other components within normal limits  CBC WITH DIFFERENTIAL/PLATELET - Abnormal; Notable for the following components:   WBC 14.2 (*)    RBC 2.63 (*)    Hemoglobin 8.9 (*)    HCT 26.2 (*)    RDW 23.4 (*)    nRBC 0.3 (*)    Neutro Abs 12.1 (*)    Abs Immature  Granulocytes 0.12 (*)    All other components within normal limits  LACTIC ACID, PLASMA - Abnormal; Notable for the following components:   Lactic Acid, Venous 3.6 (*)    All other components within normal limits  TROPONIN I (HIGH SENSITIVITY) - Abnormal; Notable for the following components:   Troponin I (High Sensitivity) 39 (*)    All other components within normal limits  SARS CORONAVIRUS 2 BY RT PCR  CULTURE, BLOOD (ROUTINE X 2)  CULTURE, BLOOD (ROUTINE X 2)  LIPASE, BLOOD  ETHANOL  CK  LACTIC ACID, PLASMA  URINALYSIS, ROUTINE W REFLEX MICROSCOPIC  URINE DRUG SCREEN, QUALITATIVE ([facility] ONLY)  TROPONIN I (HIGH SENSITIVITY)     EKG  ED ECG REPORT I, Dionne Bucy, the attending physician, personally viewed and interpreted this ECG.  Date: 03/15/2023 EKG Time: 1311 Rate: 90 Rhythm: normal sinus rhythm (in correctly read by machine as atrial fibrillation QRS Axis: normal Intervals: RBBB ST/T Wave abnormalities: normal Narrative Interpretation: no evidence of acute ischemia    RADIOLOGY  Chest x-ray: I independently viewed and interpreted the images; there is a right pleural effusion and possible consolidation  CT  head: No ICH or other acute abnormality  PROCEDURES:  Critical Care performed: Yes, see critical care procedure note(s)  .Critical Care  Performed by: Dionne Bucy, MD Authorized by: Dionne Bucy, MD   Critical care provider statement:    Critical care time (minutes):  30   Critical care time was exclusive of:  Separately billable procedures and treating other patients   Critical care was necessary to treat or prevent imminent or life-threatening deterioration of the following conditions:  CNS failure or compromise and sepsis   Critical care was time spent personally by me on the following activities:  Development of treatment plan with patient or surrogate, discussions with consultants, evaluation of patient's response to treatment, examination of patient, ordering and review of laboratory studies, ordering and review of radiographic studies, ordering and performing treatments and interventions, pulse oximetry, re-evaluation of patient's condition, review of old charts and obtaining history from patient or surrogate   Care discussed with: admitting provider      MEDICATIONS ORDERED IN ED: Medications  lactated ringers bolus 1,000 mL (has no administration in time range)  ceFEPIme (MAXIPIME) 2 g in sodium chloride 0.9 % 100 mL IVPB (2 g Intravenous New Bag/Given 03/15/23 1501)  vancomycin (VANCOCIN) IVPB 1000 mg/200 mL premix (has no administration in time range)  lactated ringers bolus 1,000 mL (1,000 mLs Intravenous New Bag/Given 03/15/23 1348)     IMPRESSION / MDM / ASSESSMENT AND PLAN / ED COURSE  I reviewed the triage vital signs and the nursing notes.  67 year old male with PMH as noted above presents with generalized weakness and altered mental status after he was found on the floor at his home today by his sisters.  They last saw him 6 days ago.  The patient is minimally verbal and is unable to give history.  His vital signs are normal.  He has no focal neurologic  deficits and is able to follow some commands.  Differential diagnosis includes, but is not limited to, dehydration, electrolyte abnormality, AKI, rhabdomyolysis, other metabolic disturbance, intracranial hemorrhage, alcohol abuse, alcohol withdrawal, other substance abuse, less likely primary cardiac etiology.  We will give fluids, obtain chest x-ray, CT head, lab workup, and reassess.  Patient's presentation is most consistent with acute presentation with potential threat to life or bodily function.  The patient is on the cardiac monitor  to evaluate for evidence of arrhythmia and/or significant heart rate changes.  ----------------------------------------- 3:07 PM on 03/15/2023 -----------------------------------------  CT head is negative.  Chest x-ray shows right pleural effusion and possible lower lobe consolidation.  WBC count is elevated.  Creatinine is also elevated from baseline.  Lactate is elevated.  X-ray findings are concerning for aspiration pneumonia and lab workup is concerning for sepsis.  I have ordered broad-spectrum antibiotics and additional fluids.  The patient will need admission for further workup and monitoring.  I consulted Dr. Huel Cote from the hospitalist service; based our discussion she agrees to evaluate the patient for admission.   FINAL CLINICAL IMPRESSION(S) / ED DIAGNOSES   Final diagnoses:  Altered mental status, unspecified altered mental status type  Sepsis with acute renal failure without septic shock, due to unspecified organism, unspecified acute renal failure type (HCC)  Pleural effusion     Rx / DC Orders   ED Discharge Orders     None        Note:  This document was prepared using Dragon voice recognition software and may include unintentional dictation errors.    Dionne Bucy, MD 03/15/23 [phone]

## 2023-03-15 NOTE — Assessment & Plan Note (Addendum)
Hemoglobin of 8.9 with no evidence of bleeding on examination other than significant bruising all over.  I suspect this may be due to his alcohol use disorder with associated malnutrition and bone marrow suppression.  - Daily folic acid and thiamine - Iron panel, B12, folic acid levels pending - Transfuse for hemoglobin less than 7 - Repeat CBC in the a.m.

## 2023-03-15 NOTE — Assessment & Plan Note (Addendum)
I suspect patient will likely need a chest tube given size of effusion.  - CT of the chest for further characterization - IR consulted for thoracentesis

## 2023-03-15 NOTE — Progress Notes (Signed)
PHARMACY -  BRIEF ANTIBIOTIC NOTE   Pharmacy has received consults for vancomycin and cefepime from an ED provider.  The patient's profile has been reviewed for ht/wt/allergies/indication/available labs.    One time orders placed for vancomycin 1,000 mg x 1 and cefepime 2 grams x 1  Further antibiotics/pharmacy consults should be ordered by admitting physician if indicated.                       Thank you,  Elliot Gurney, PharmD, BCPS Clinical Pharmacist  03/15/2023 2:43 PM

## 2023-03-15 NOTE — ED Notes (Signed)
Radiology at bedside

## 2023-03-15 NOTE — Assessment & Plan Note (Signed)
Initial troponin elevated at 39 with no reported chest pain or previous history of cardiac disease.  Will continue to trend, however I suspect this is demand in the setting of pneumonia, profound hypovolemia, AKI.  - Repeat troponin pending

## 2023-03-15 NOTE — ED Notes (Addendum)
Patient transported to CT 

## 2023-03-15 NOTE — Progress Notes (Addendum)
Pharmacy Antibiotic Note  Mark Best is a 67 y.o. male admitted on 03/15/2023 with aspiration pneumonia.  Pharmacy has been consulted for Unasyn dosing.  Plan: Unasyn (ampicillin/sulbactam) IV 3 grams every 12 hours to start 7/16 @1600  (24 hours from previous cefepime dose)  Weight: 59.4 kg (131 lb)  Temp (24hrs), Avg:97.7 F (36.5 C), Min:97.7 F (36.5 C), Max:97.7 F (36.5 C)  Recent Labs  Lab 03/15/23 1316  WBC 14.2*  CREATININE 2.35*  LATICACIDVEN 3.6*    Estimated Creatinine Clearance: 24.8 mL/min (A) (by C-G formula based on SCr of 2.35 mg/dL (H)).    Allergies  Allergen Reactions   Azithromycin Rash    Antimicrobials this admission: Cefepime 03/15/23 x 1 dose  Vancomycin 03/15/23 x 1 dose Unasyn 7/15 >>  Dose adjustments this admission: N/a  Microbiology results: 7/15 BCx: in process  Thank you for allowing pharmacy to be a part of this patient's care.  Elliot Gurney, PharmD, BCPS Clinical Pharmacist  03/15/2023 5:48 PM

## 2023-03-15 NOTE — TOC Initial Note (Signed)
Transition of Care [facility]) - Initial/Assessment Note    Patient Details  Name: Mark Best MRN: [id number] Date of Birth: September 01, 1955  Transition of Care [facility]) CM/SW Contact:    Kreg Shropshire, RN Phone Number: 03/15/2023, 4:25 PM  Clinical Narrative:                 Cm received TOC consult for Substance Abuse. Pt does not want to talk at this time for Substance abuse education. Pt has sisters at bedside who live nearby who help with ADLs, transportation, and meals. He does not have any HH services at this time. No DME at home. Sisters stated that pt has been falling at home. Cm will continue to follow for toc needs    Barriers to Discharge: Continued Medical Work up   Patient Goals and CMS Choice            Expected Discharge Plan and Services       Living arrangements for the past 2 months: Single Family Home                                      Prior Living Arrangements/Services Living arrangements for the past 2 months: Single Family Home Lives with:: Self          Need for Family Participation in Patient Care: Yes (Comment) Care giver support system in place?: Yes (comment)      Activities of Daily Living      Permission Sought/Granted            Permission granted to share info w Relationship: Sisters Joyce Gross and Lupita Leash     Emotional Assessment              Admission diagnosis:  Aspiration pneumonia [facility]) [J69.0] Patient Active Problem List   Diagnosis Date Noted   Alcohol use disorder 03/15/2023   Normocytic anemia 03/15/2023   AKI (acute kidney injury) (HCC) 03/15/2023   Elevated LFTs 03/15/2023   Aspiration pneumonia (HCC) 03/15/2023   Parapneumonic effusion 03/15/2023   Demand ischemia 03/15/2023   Abnormal CT scan, colon    Polyp of transverse colon    PCP:  Practice, [facility]:   [facility] [phone] [address], [place] - [address] [place] [address] Phone: [phone] Fax:  [phone]     Social Determinants of Health (SDOH) Social History: SDOH Screenings   Tobacco Use: High Risk (04/19/2022)   SDOH Interventions:     Readmission Risk Interventions     No data to display

## 2023-03-15 NOTE — Assessment & Plan Note (Signed)
Patient presenting with moderate to large pleural effusion with associated consolidation on chest x-ray consistent with aspiration pneumonia given alcohol use disorder and being found down.  Patient does have an elevated WBC and lactic acid, however no other criteria to meet sepsis or SIRS, given his vital signs are quite normal.  - CT of the chest for further characterization of effusion - Discontinue vancomycin and cefepime - Start Unasyn - Continuous pulse oximetry - Supplemental oxygen as needed to maintain oxygen saturation above 88%

## 2023-03-15 NOTE — ED Notes (Signed)
Family and patient made aware of need for urine specimen, urinal placed at bedside. Patient appears very dry; will attempt for specimen post IVF.

## 2023-03-15 NOTE — ED Notes (Signed)
Patient is resting in bed. CIWAA scored at 0 due to patient being asleep but arousable.

## 2023-03-15 NOTE — ED Triage Notes (Signed)
Pt arrives via [facility] for fall and AMS. Per family, pt is noncompliant with medications and drinks alcohol heavily. EMS copious amounts of liquor in the house, suspecting he has drank today. Blood sugar 161 per EMS. Pt alert upon arrival but will not answer questions. Skin tear to bilateral elbows. Pupils PERRLA.

## 2023-03-15 NOTE — Assessment & Plan Note (Signed)
Patient is present with disorientation, not answering questions or following commands.  Likely multi factorial in the setting of pneumonia, potential underlying cirrhosis, and alcohol withdrawal.  No focal abnormalities to suggest CVA or TIA  - No indication at this time for further brain imaging

## 2023-03-15 NOTE — Assessment & Plan Note (Addendum)
I suspect this is multifactorial in the setting of profound hypovolemia with high daily alcohol use and unknown downtime, now with also underlying pneumonia.  - Aggressive IV fluid resuscitation - Repeat BMP in the a.m. - Strict in and out - Avoid nephrotoxic agents - Evaluate for retention with ultrasound

## 2023-03-15 NOTE — Assessment & Plan Note (Addendum)
Elevated AST with evidence of decreased albumin concerning for underlying cirrhosis.  Physical exam notable for facial telangiectasias, abdominal distention.   - Repeat CMP in the a.m. - PT/INR - Hepatic ultrasound with evaluation for ascites as well

## 2023-03-16 ENCOUNTER — Other Ambulatory Visit: Payer: Self-pay

## 2023-03-16 ENCOUNTER — Observation Stay: Payer: Medicare Other

## 2023-03-16 ENCOUNTER — Inpatient Hospital Stay (HOSPITAL_COMMUNITY)
Admit: 2023-03-16 | Discharge: 2023-03-16 | Disposition: A | Discharge status: Home / Self Care | Payer: Medicare Other | Attending: Student | Admitting: Student

## 2023-03-16 ENCOUNTER — Inpatient Hospital Stay: Payer: Medicare Other

## 2023-03-16 DIAGNOSIS — E872 Acidosis, unspecified: Secondary | ICD-10-CM | POA: Diagnosis present

## 2023-03-16 DIAGNOSIS — E46 Unspecified protein-calorie malnutrition: Secondary | ICD-10-CM | POA: Diagnosis present

## 2023-03-16 DIAGNOSIS — F1010 Alcohol abuse, uncomplicated: Secondary | ICD-10-CM | POA: Diagnosis present

## 2023-03-16 DIAGNOSIS — G9341 Metabolic encephalopathy: Secondary | ICD-10-CM | POA: Diagnosis present

## 2023-03-16 DIAGNOSIS — J918 Pleural effusion in other conditions classified elsewhere: Secondary | ICD-10-CM | POA: Diagnosis present

## 2023-03-16 DIAGNOSIS — L89012 Pressure ulcer of right elbow, stage 2: Secondary | ICD-10-CM | POA: Diagnosis present

## 2023-03-16 DIAGNOSIS — R7989 Other specified abnormal findings of blood chemistry: Secondary | ICD-10-CM

## 2023-03-16 DIAGNOSIS — D539 Nutritional anemia, unspecified: Secondary | ICD-10-CM | POA: Diagnosis present

## 2023-03-16 DIAGNOSIS — F17210 Nicotine dependence, cigarettes, uncomplicated: Secondary | ICD-10-CM | POA: Diagnosis present

## 2023-03-16 DIAGNOSIS — E860 Dehydration: Secondary | ICD-10-CM | POA: Diagnosis present

## 2023-03-16 DIAGNOSIS — I959 Hypotension, unspecified: Secondary | ICD-10-CM | POA: Diagnosis present

## 2023-03-16 DIAGNOSIS — N179 Acute kidney failure, unspecified: Secondary | ICD-10-CM | POA: Diagnosis present

## 2023-03-16 DIAGNOSIS — Z515 Encounter for palliative care: Secondary | ICD-10-CM | POA: Diagnosis not present

## 2023-03-16 DIAGNOSIS — Z66 Do not resuscitate: Secondary | ICD-10-CM | POA: Diagnosis present

## 2023-03-16 DIAGNOSIS — L89022 Pressure ulcer of left elbow, stage 2: Secondary | ICD-10-CM | POA: Diagnosis present

## 2023-03-16 DIAGNOSIS — R64 Cachexia: Secondary | ICD-10-CM | POA: Diagnosis present

## 2023-03-16 DIAGNOSIS — J690 Pneumonitis due to inhalation of food and vomit: Secondary | ICD-10-CM | POA: Diagnosis present

## 2023-03-16 DIAGNOSIS — I700 Atherosclerosis of aorta: Secondary | ICD-10-CM | POA: Diagnosis present

## 2023-03-16 DIAGNOSIS — Z1152 Encounter for screening for COVID-19: Secondary | ICD-10-CM | POA: Diagnosis not present

## 2023-03-16 DIAGNOSIS — K760 Fatty (change of) liver, not elsewhere classified: Secondary | ICD-10-CM | POA: Diagnosis present

## 2023-03-16 DIAGNOSIS — I2489 Other forms of acute ischemic heart disease: Secondary | ICD-10-CM | POA: Diagnosis present

## 2023-03-16 DIAGNOSIS — R54 Age-related physical debility: Secondary | ICD-10-CM | POA: Diagnosis present

## 2023-03-16 DIAGNOSIS — Z681 Body mass index (BMI) 19 or less, adult: Secondary | ICD-10-CM | POA: Diagnosis not present

## 2023-03-16 DIAGNOSIS — D638 Anemia in other chronic diseases classified elsewhere: Secondary | ICD-10-CM | POA: Diagnosis present

## 2023-03-16 DIAGNOSIS — Z7189 Other specified counseling: Secondary | ICD-10-CM | POA: Diagnosis not present

## 2023-03-16 DIAGNOSIS — K7031 Alcoholic cirrhosis of liver with ascites: Secondary | ICD-10-CM | POA: Diagnosis present

## 2023-03-16 LAB — URINE DRUG SCREEN, QUALITATIVE (ARMC ONLY)
Amphetamines, Ur Screen: NOT DETECTED
Barbiturates, Ur Screen: NOT DETECTED
Benzodiazepine, Ur Scrn: POSITIVE — AB
Cannabinoid 50 Ng, Ur ~~LOC~~: NOT DETECTED
Cocaine Metabolite,Ur ~~LOC~~: NOT DETECTED
MDMA (Ecstasy)Ur Screen: NOT DETECTED
Methadone Scn, Ur: NOT DETECTED
Opiate, Ur Screen: NOT DETECTED
Phencyclidine (PCP) Ur S: NOT DETECTED
Tricyclic, Ur Screen: NOT DETECTED

## 2023-03-16 LAB — CBC
HCT: 22.1 % — ABNORMAL LOW (ref 39.0–52.0)
Hemoglobin: 7.4 g/dL — ABNORMAL LOW (ref 13.0–17.0)
MCH: 34.3 pg — ABNORMAL HIGH (ref 26.0–34.0)
MCHC: 33.5 g/dL (ref 30.0–36.0)
MCV: 102.3 fL — ABNORMAL HIGH (ref 80.0–100.0)
Platelets: 147 K/uL — ABNORMAL LOW (ref 150–400)
RBC: 2.16 MIL/uL — ABNORMAL LOW (ref 4.22–5.81)
RDW: 23.6 % — ABNORMAL HIGH (ref 11.5–15.5)
WBC: 12.9 K/uL — ABNORMAL HIGH (ref 4.0–10.5)
nRBC: 0.2 % (ref 0.0–0.2)

## 2023-03-16 LAB — ECHOCARDIOGRAM COMPLETE
AR max vel: 2.71 cm2
AV Area VTI: 2.67 cm2
AV Area mean vel: 2.39 cm2
AV Mean grad: 2 mmHg
AV Peak grad: 5 mmHg
Ao pk vel: 1.12 m/s
Area-P 1/2: 2.83 cm2
Height: 71 in
MV VTI: 2.25 cm2
S' Lateral: 2.6 cm
Weight: 2024.7 [oz_av]

## 2023-03-16 LAB — COMPREHENSIVE METABOLIC PANEL WITH GFR
ALT: 26 U/L (ref 0–44)
AST: 63 U/L — ABNORMAL HIGH (ref 15–41)
Albumin: 1.7 g/dL — ABNORMAL LOW (ref 3.5–5.0)
Alkaline Phosphatase: 101 U/L (ref 38–126)
Anion gap: 9 (ref 5–15)
BUN: 28 mg/dL — ABNORMAL HIGH (ref 8–23)
CO2: 19 mmol/L — ABNORMAL LOW (ref 22–32)
Calcium: 7.9 mg/dL — ABNORMAL LOW (ref 8.9–10.3)
Chloride: 110 mmol/L (ref 98–111)
Creatinine, Ser: 2.09 mg/dL — ABNORMAL HIGH (ref 0.61–1.24)
GFR, Estimated: 34 mL/min — ABNORMAL LOW (ref >60)
Glucose, Bld: 89 mg/dL (ref 70–99)
Potassium: 3.5 mmol/L (ref 3.5–5.1)
Sodium: 138 mmol/L (ref 135–145)
Total Bilirubin: 1.2 mg/dL (ref 0.3–1.2)
Total Protein: 6.5 g/dL (ref 6.5–8.1)

## 2023-03-16 LAB — URINALYSIS, ROUTINE W REFLEX MICROSCOPIC
Bilirubin Urine: NEGATIVE
Glucose, UA: NEGATIVE mg/dL
Ketones, ur: 5 mg/dL — AB
Leukocytes,Ua: NEGATIVE
Nitrite: NEGATIVE
Protein, ur: 100 mg/dL — AB
Specific Gravity, Urine: 1.025 (ref 1.005–1.030)
pH: 5 (ref 5.0–8.0)

## 2023-03-16 LAB — BRAIN NATRIURETIC PEPTIDE: B Natriuretic Peptide: 1072.4 pg/mL — ABNORMAL HIGH (ref 0.0–100.0)

## 2023-03-16 LAB — PROTIME-INR
INR: 4 — ABNORMAL HIGH (ref 0.8–1.2)
Prothrombin Time: 39.3 s — ABNORMAL HIGH (ref 11.4–15.2)

## 2023-03-16 LAB — AMMONIA: Ammonia: 25 umol/L (ref 9–35)

## 2023-03-16 LAB — GLUCOSE, CAPILLARY: Glucose-Capillary: 71 mg/dL (ref 70–99)

## 2023-03-16 LAB — VITAMIN B12: Vitamin B-12: 2749 pg/mL — ABNORMAL HIGH (ref 180–914)

## 2023-03-16 LAB — TSH: TSH: 13.255 u[IU]/mL — ABNORMAL HIGH (ref 0.350–4.500)

## 2023-03-16 LAB — VITAMIN D 25 HYDROXY (VIT D DEFICIENCY, FRACTURES): Vit D, 25-Hydroxy: 31.57 ng/mL (ref 30–100)

## 2023-03-16 LAB — LACTIC ACID, PLASMA: Lactic Acid, Venous: 1.4 mmol/L (ref 0.5–1.9)

## 2023-03-16 LAB — HEPATITIS PANEL, ACUTE
HCV Ab: NONREACTIVE
Hep A IgM: NONREACTIVE
Hep B C IgM: NONREACTIVE
Hepatitis B Surface Ag: NONREACTIVE

## 2023-03-16 LAB — APTT: aPTT: 43 seconds — ABNORMAL HIGH (ref 24–36)

## 2023-03-16 LAB — T4, FREE: Free T4: 1.23 ng/dL — ABNORMAL HIGH (ref 0.61–1.12)

## 2023-03-16 LAB — HIV ANTIBODY (ROUTINE TESTING W REFLEX): HIV Screen 4th Generation wRfx: NONREACTIVE

## 2023-03-16 MED ORDER — ALBUMIN HUMAN 5 % IV SOLN
12.5000 g | Freq: Four times a day (QID) | INTRAVENOUS | Status: AC
  Administered 2023-03-16 – 2023-03-17 (×3): 12.5 g via INTRAVENOUS
  Filled 2023-03-16 (×3): qty 250

## 2023-03-16 MED ORDER — PHYTONADIONE 5 MG PO TABS: 5.0000 mg | ORAL_TABLET | Freq: Once | ORAL | Status: DC

## 2023-03-16 MED ORDER — LACTATED RINGERS IV SOLN: INTRAVENOUS | Status: DC

## 2023-03-16 MED ORDER — SODIUM CHLORIDE 0.9 % IV BOLUS
1000.0000 mL | Freq: Once | INTRAVENOUS | Status: DC
Start: 2023-03-16 — End: 2023-03-16

## 2023-03-16 MED ORDER — SODIUM CHLORIDE 0.9 % IV BOLUS
500.0000 mL | Freq: Once | INTRAVENOUS | Status: AC
  Administered 2023-03-16: 500 mL via INTRAVENOUS

## 2023-03-16 MED ORDER — CHLORHEXIDINE GLUCONATE CLOTH 2 % EX PADS
6.0000 | MEDICATED_PAD | Freq: Every day | CUTANEOUS | Status: DC
  Administered 2023-03-16 – 2023-03-17 (×2): 6 via TOPICAL

## 2023-03-16 MED ORDER — MIDODRINE HCL 5 MG PO TABS: 10.0000 mg | ORAL_TABLET | Freq: Three times a day (TID) | ORAL | Status: DC

## 2023-03-16 MED ORDER — SODIUM BICARBONATE 650 MG PO TABS
650.0000 mg | ORAL_TABLET | Freq: Three times a day (TID) | ORAL | Status: DC
  Filled 2023-03-16 (×5): qty 1

## 2023-03-16 MED ORDER — VITAMIN K1 10 MG/ML IJ SOLN
5.0000 mg | Freq: Once | INTRAVENOUS | Status: AC
  Administered 2023-03-16: 5 mg via INTRAVENOUS
  Filled 2023-03-16: qty 0.5

## 2023-03-16 MED ORDER — SODIUM BICARBONATE 8.4 % IV SOLN
50.0000 meq | Freq: Once | INTRAVENOUS | Status: AC
  Administered 2023-03-16: 50 meq via INTRAVENOUS
  Filled 2023-03-16: qty 50

## 2023-03-16 NOTE — Progress Notes (Signed)
[facility] Progress Note  Patient: Mark Best    ZHY:[id number]  DOA: 03/15/2023     Date of Service: the patient was seen and examined on 03/16/2023  Chief Complaint  Patient presents with   Fall   Altered Mental Status   Brief hospital course: Mark Best is a 67 y.o. 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  History was obtained from his discharge.  Patient is alcoholic for past 20 years which has increased for past few years since his retirement.  Patient had some diarrhea and feeling fatigue and weak for the past week.  Patient did not answer phone call, so was discharged went to check on him and patient was found on the floor. ED workup: VS stable, saturating well on room air Creatinine 2.35, bicarb 19, glucose 135, albumin 1.5, CK1 38, troponin 39 slightly elevated, lactic acid 3.6, WBC 14.2, hemoglobin 8.9 COVID PCR negative, EtOH level less than 10 CT head negative for any acute findings CXR: Moderate, layering right pleural effusion and associated atelectasis or consolidation. Left lung normally aerated. CT a/p: Moderate RIGHT pleural effusion. Consolidation of the RIGHT LOWER lobe.  A 4 millimeter nodule in the LEFT LOWER lobe. Solid pulmonary nodule measuring 4 mm. Per Fleischner Society Guidelines, no routine follow-up imaging is recommended.  Severe hepatic steatosis.  2.2 centimeter hypodense lesion and 2.1 centimeter hyperdense lesion adjacent to the falciform ligament. Recommend further evaluation with MRI of the abdomen with and without contrast. Distended gallbladder.  Ascites.  Aortic Atherosclerosis     Assessment and Plan:  Aspiration pneumonia with pleural effusion S/p vancomycin and cefepime Continue Unasyn Continue supplemental O2 admission as needed Continue aspiration precautions  Parapneumonic pleural effusion CT scan shows moderate  right pleural effusion as above IR consulted for thoracentesis, follow fluid studies Continue antibiotics as above  Hypotension most likely due to dehydration and AMS, decreased sympathetic activity S/p LR bolus given Avoided too much IV fluid due to ascites and elevated BNP Started LR 75 mill per hour as patient is n.p.o. S/p IV albumin 12.5 g every 6 hourly x 3 doses given Started midodrine 10 mg p.o. 3 times daily when patient will be able to take oral meds   AKI, baseline creatinine 0.81 Creatinine 2.35 on admission most likely due to dehydration No urinary retention on ultrasound IV fluid bolus given, continue maintenance IV fluid Monitor renal functions and urine output Avoid nephrotoxic medications, use renally dose medications Metabolic acidosis, bicarb IV infusion given, started oral bicarbonate  Liver cirrhosis with ascites due to EtOH Acute metabolic encephalopathy, Ammonia level within normal range CT scan reviewed as above, patient has liver lesion, needs MRI, recommend to follow as an outpatient with PCP when patient is more stable Child's Pugh score 12 points, class C, 1 to 3 years mortality INR 4.0 elevated due to liver cirrhosis.  Vitamin K 5 mg IV one-time dose given Slightly elevated LFTs, continue to monitor   Demand ischemia, troponin slightly elevated Patient denies any chest pain Elevated BNP Follow 2D echocardiogram  EtOH abuse, metabolic encephalopathy possible Wernicke's and malnutrition Continue multivitamin, thiamine and folic acid Continue to monitor for withdrawal symptoms  Macrocytic anemia of chronic disease and due to liver cirrhosis Iron 51, transferrin ratio could not be measured, folate within normal range, B12 elevated Monitor H&H and transfuse if hemoglobin less than 7   Abnormal TSH level, TSH 13.25  Follow Free T4 level    Body mass index is 17.65 kg/m.  Interventions:  Diet: NPO  DVT Prophylaxis: SCD, pharmacological  prophylaxis contraindicated due to INR elevated     Advance goals of care discussion: DNR  Family Communication: family was present at bedside, at the time of interview.  Overall poor prognosis, patient has significant metabolic encephalopathy and unable to communicate.  Disposition:  Pt is from Home, admitted with pneumonia, metabolic encephalopathy, liver cirrhosis, AKI and dehydration, still on IV antibiotics, n.p.o. and IV fluid, which precludes a safe discharge. Discharge to SNF vs HH, when stable.  Subjective: No significant overnight events, patient was laying in the bed without any acute distress but completely altered mental status, moaning, unable to communicate, moving spontaneously.  Seems very emaciated, lethargic and malnourished.  Physical Exam: General: NAD, lying comfortably Appear in no distress, affect appropriate Eyes: PERRLA ENT: Oral Mucosa Clear, dry Neck: no JVD,  Cardiovascular: S1 and S2 Present, no Murmur,  Respiratory: good respiratory effort, Bilateral Air entry equal and Decreased, no Crackles, no wheezes Abdomen: Bowel Sound present, Soft and no tenderness,  Skin: no rashes Extremities: no Pedal edema, no calf tenderness Neurologic: without any new focal findings Gait not checked due to patient safety concerns  Vitals:   03/16/23 1200 03/16/23 1300 03/16/23 1330 03/16/23 1344  BP: (!) 83/61 (!) 91/53    Pulse:      Resp: 11 17 17    Temp:      TempSrc:      SpO2:    96%  Weight:      Height:        Intake/Output Summary (Last 24 hours) at 03/16/2023 1402 Last data filed at 03/16/2023 1342 Gross per 24 hour  Intake 250 ml  Output --  Net 250 ml   Filed Weights   03/15/23 1240 03/15/23 1700  Weight: 59.4 kg 57.4 kg    Data Reviewed: I have personally reviewed and interpreted daily labs, tele strips, imagings as discussed above. I reviewed all nursing notes, pharmacy notes, vitals, pertinent old records I have discussed plan of care as  described above with RN and patient/family.  CBC: Recent Labs  Lab 03/15/23 1316 03/16/23 0534  WBC 14.2* 12.9*  NEUTROABS 12.1*  --   HGB 8.9* 7.4*  HCT 26.2* 22.1*  MCV 99.6 102.3*  PLT 164 147*   Basic Metabolic Panel: Recent Labs  Lab 03/15/23 1316 03/15/23 1826 03/16/23 0534  NA 138  --  138  K 4.7  --  3.5  CL 107  --  110  CO2 19*  --  19*  GLUCOSE 135*  --  89  BUN 29*  --  28*  CREATININE 2.35*  --  2.09*  CALCIUM 8.0*  --  7.9*  MG  --  2.2  --   PHOS  --  3.5  --     Studies: US PELVIS LIMITED (TRANSABDOMINAL ONLY)  Result Date: 03/16/2023 CLINICAL DATA:  Acute kidney injury. EXAM: LIMITED ULTRASOUND OF PELVIS TECHNIQUE: Limited transabdominal ultrasound examination of the pelvis was performed. COMPARISON:  None Available. FINDINGS: Mild to moderate amount of ascites is noted in the visualized pelvis. Urinary bladder is unremarkable with calculated prevoid volume of 18 mL. Exam is limited as patient did not cooperate with positioning or commands. IMPRESSION: Mild to moderate ascites seen in the pelvis. Electronically Signed   By: Lupita Raider M.D.   On: 03/16/2023 10:38   US Abdomen Complete  Result  Date: 03/16/2023 CLINICAL DATA:  Acute kidney injury.  Elevated liver function tests. EXAM: ABDOMEN ULTRASOUND COMPLETE COMPARISON:  February 17, 2022. FINDINGS: Gallbladder: No gallstones or wall thickening visualized. No sonographic Murphy sign noted by sonographer. Common bile duct: Diameter: 4 mm which is within normal limits. Liver: Increased echogenicity of hepatic parenchyma is noted suggesting hepatic steatosis. 2.1 cm cyst is seen in right hepatic lobe. Portal vein is patent on color Doppler imaging with normal direction of blood flow towards the liver. IVC: No abnormality visualized. Pancreas: Visualized portion unremarkable. Spleen: Size and appearance within normal limits. Right Kidney: Not visualized due to patient not cooperating with positioning. Left  Kidney: Length: 10.5 cm. Echogenicity within normal limits. No mass or hydronephrosis visualized. Abdominal aorta: Not visualized due to patient not cooperating with positioning. Other findings: Minimal ascites is noted. IMPRESSION: Right kidney and abdominal aorta not visualized due to patient not cooperating with positioning. Hepatic steatosis. Right hepatic cyst. Minimal ascites. Electronically Signed   By: Lupita Raider M.D.   On: 03/16/2023 10:36   CT CHEST WO CONTRAST  Result Date: 03/15/2023 CLINICAL DATA:  Pleural effusion. Nondiagnostic x-ray. Fall. Altered mental status. EXAM: CT CHEST WITHOUT CONTRAST TECHNIQUE: Multidetector CT imaging of the chest was performed following the standard protocol without IV contrast. RADIATION DOSE REDUCTION: This exam was performed according to the departmental dose-optimization program which includes automated exposure control, adjustment of the mA and/or kV according to patient size and/or use of iterative reconstruction technique. COMPARISON:  Chest x-ray 03/15/2023, CT of the chest abdomen and pelvis on 01/14/2022 FINDINGS: Cardiovascular: Heart size is normal. There is no pericardial effusion. There is coronary artery calcification. The thoracic aorta is not aneurysmal but is partially calcified. Normal appearance of the noncontrast pulmonary arteries. Mediastinum/Nodes: The visualized portion of the thyroid gland has a normal appearance. Esophagus is unremarkable. No significant mediastinal, hilar, or axillary adenopathy. Lungs/Pleura: Moderate RIGHT pleural effusion. There is consolidation of the RIGHT LOWER lobe. Airways are patent. There is a 4 millimeter nodule within the superior segment of the LEFT LOWER lobe. Upper Abdomen: There is marked fatty infiltration of the liver. Oval mass adjacent to the gallbladder fossa is 2.2 centimeters (image 154 of series 2). There is a 2.0 centimeter hyperdensity adjacent to the falciform ligament, possibly related to  heterogeneous fatty infiltration (image 158 of series 2). Lesion is not excluded. Gallbladder is distended. Perihepatic ascites. Musculoskeletal: Moderate degenerative changes throughout the thoracic spine. No acute fractures. IMPRESSION: 1. Moderate RIGHT pleural effusion. 2. Consolidation of the RIGHT LOWER lobe. 3. A 4 millimeter nodule in the LEFT LOWER lobe. Solid pulmonary nodule measuring 4 mm. Per Fleischner Society Guidelines, no routine follow-up imaging is recommended. These guidelines do not apply to immunocompromised patients and patients with cancer. Follow up in patients with significant comorbidities as clinically warranted. For lung cancer screening, adhere to Lung-RADS guidelines. Reference: Radiology. 2017; 284(1):228-43. 4. Severe hepatic steatosis. 5. 2.2 centimeter hypodense lesion and 2.1 centimeter hyperdense lesion adjacent to the falciform ligament. Recommend further evaluation with MRI of the abdomen with and without contrast. 6. Distended gallbladder. 7. Ascites. 8.  Aortic Atherosclerosis (ICD10-I70.0). Electronically Signed   By: Norva Pavlov M.D.   On: 03/15/2023 16:48   CT Head Wo Contrast  Result Date: 03/15/2023 CLINICAL DATA:  Altered mental status EXAM: CT HEAD WITHOUT CONTRAST TECHNIQUE: Contiguous axial images were obtained from the base of the skull through the vertex without intravenous contrast. RADIATION DOSE REDUCTION: This exam was performed according to  the departmental dose-optimization program which includes automated exposure control, adjustment of the mA and/or kV according to patient size and/or use of iterative reconstruction technique. COMPARISON:  04/19/2022 FINDINGS: There is patient motion in many of the images. Additional images were repeated. Brain: No acute intracranial findings are seen. There are no signs of bleeding within the cranium. Cortical sulci are prominent. There is decreased density in periventricular and subcortical white matter.  Vascular: Unremarkable. Skull: No displaced fracture is seen. Sinuses/Orbits: There is mucosal thickening in sphenoid sinus. Other: None. IMPRESSION: Motion limited study. No acute intracranial findings are seen. Atrophy. Small-vessel disease. Mild chronic sphenoid sinusitis. Electronically Signed   By: Ernie Avena M.D.   On: 03/15/2023 14:28    Scheduled Meds:  multivitamin with minerals  1 tablet Oral Daily   sodium bicarbonate  650 mg Oral TID   sodium chloride flush  3 mL Intravenous Q12H   thiamine  100 mg Oral Daily   Or   thiamine  100 mg Intravenous Daily   Continuous Infusions:  ampicillin-sulbactam (UNASYN) IV     folic acid 1 mg in sodium chloride 0.9 % 50 mL IVPB 1 mg (03/16/23 1343)   PRN Meds: acetaminophen **OR** acetaminophen, LORazepam **OR** LORazepam, ondansetron **OR** ondansetron (ZOFRAN) IV  Time spent: 55 minutes  Author: Gillis Santa. MD [facility] 03/16/2023 2:02 PM  To reach On-call, see care teams to locate the attending and reach out to them via [email or web address]. If 7PM-7AM, please contact night-coverage If you still have difficulty reaching the attending provider, please page the [facility] (Director on Call) for [facility] on amion for assistance.

## 2023-03-16 NOTE — Progress Notes (Signed)
PT Cancellation Note  Patient Details Name: EVERETTE MALL MRN: [id number] DOB: 1956/02/03   Cancelled Treatment:    Reason Eval/Treat Not Completed: Patient's level of consciousness;Patient at procedure or test/unavailable. PT order received. Chart reviewed. Pt undergoing ECHO upon arrival to room. Per RN, pt has been lethargic, non-verbal, since admission. Does not participate in care, only verbalizations are moaning out in pain with bed mobility. Nursing states pt is not appropriate or able to participate in therapy session this date. Will hold and re-attempt at a later date/time as appropriate.    Howie Ill, PT, DPT 03/16/23 1:46 PM

## 2023-03-16 NOTE — ED Notes (Signed)
Echo at bedside

## 2023-03-16 NOTE — Progress Notes (Signed)
Pt was admitted with any PIV and RN reported on hand off that pt pulled it out and PICC line order was place.

## 2023-03-16 NOTE — Progress Notes (Signed)
Received patient from ED to room 8, pt is moaning and speech is incomprehensible.move all extremities, dose not follow any command. VSS, pupils are 2 mm and reactive to light. Pt have multiple busing and skin tears all over his. Body. Patient was made comfortable in bed, bed alarm on, pt pulling on pulling on ekg wires, mitt applied. Patient covered with warm blankets. Marland Kitchen

## 2023-03-16 NOTE — Progress Notes (Signed)
Primary RN made aware PICC line would not be placed today. RN in agreement, and will place an IVT consult for further access.

## 2023-03-16 NOTE — Progress Notes (Signed)
*  PRELIMINARY RESULTS* Echocardiogram 2D Echocardiogram has been performed.  Carolyne Fiscal 03/16/2023, 1:44 PM

## 2023-03-16 NOTE — Progress Notes (Addendum)
Contacted sister Gwenyth Allegra to discuss consent for bedside PICC placement including risks, benefits, and alternatives. After our discussion, she stated she would rather wait so the her sister and her could speak with MD tomorrow. Duaine Dredge RN notified.

## 2023-03-16 NOTE — Progress Notes (Signed)
OT Cancellation Note  Patient Details Name: LONNEY REVAK MRN: [id number] DOB: 1956-05-12   Cancelled Treatment:    Reason Eval/Treat Not Completed: Patient's level of consciousness;Patient at procedure or test/ unavailable. OT order received. Chart reviewed. Upon arrival to pt room, pt undergoing ECHO. Per nsg, pt has been lethargic, non-verbal, since admission. Does not participate in care, only verbalizations are moaning out in pain with bed mobility. Nsg states pt is not appropriate or able to participate in therapy session this date. Will hold and re-attempt at a later date/time as available and pt able to participate.   Rockney Ghee, M.S., OTR/L 03/16/23, 1:24 PM

## 2023-03-17 ENCOUNTER — Encounter: Payer: Self-pay | Admitting: Student

## 2023-03-17 DIAGNOSIS — Z7189 Other specified counseling: Secondary | ICD-10-CM

## 2023-03-17 DIAGNOSIS — J690 Pneumonitis due to inhalation of food and vomit: Secondary | ICD-10-CM | POA: Diagnosis not present

## 2023-03-17 LAB — BASIC METABOLIC PANEL
Anion gap: 12 (ref 5–15)
BUN: 26 mg/dL — ABNORMAL HIGH (ref 8–23)
CO2: 18 mmol/L — ABNORMAL LOW (ref 22–32)
Calcium: 8.2 mg/dL — ABNORMAL LOW (ref 8.9–10.3)
Chloride: 111 mmol/L (ref 98–111)
Creatinine, Ser: 1.66 mg/dL — ABNORMAL HIGH (ref 0.61–1.24)
GFR, Estimated: 45 mL/min — ABNORMAL LOW (ref >60)
Glucose, Bld: 81 mg/dL (ref 70–99)
Potassium: 2.8 mmol/L — ABNORMAL LOW (ref 3.5–5.1)
Sodium: 141 mmol/L (ref 135–145)

## 2023-03-17 LAB — IRON AND TIBC: Iron: 40 ug/dL — ABNORMAL LOW (ref 45–182)

## 2023-03-17 LAB — MAGNESIUM: Magnesium: 1.9 mg/dL (ref 1.7–2.4)

## 2023-03-17 LAB — GLUCOSE, CAPILLARY
Glucose-Capillary: 73 mg/dL (ref 70–99)
Glucose-Capillary: 120 mg/dL — ABNORMAL HIGH (ref 70–99)
Glucose-Capillary: 62 mg/dL — ABNORMAL LOW (ref 70–99)
Glucose-Capillary: 75 mg/dL (ref 70–99)

## 2023-03-17 LAB — CBC
HCT: 19.7 % — ABNORMAL LOW (ref 39.0–52.0)
Hemoglobin: 6.6 g/dL — ABNORMAL LOW (ref 13.0–17.0)
MCH: 33.7 pg (ref 26.0–34.0)
MCHC: 33.5 g/dL (ref 30.0–36.0)
MCV: 100.5 fL — ABNORMAL HIGH (ref 80.0–100.0)
Platelets: 139 10*3/uL — ABNORMAL LOW (ref 150–400)
RBC: 1.96 MIL/uL — ABNORMAL LOW (ref 4.22–5.81)
RDW: 23.4 % — ABNORMAL HIGH (ref 11.5–15.5)
WBC: 11.7 10*3/uL — ABNORMAL HIGH (ref 4.0–10.5)
nRBC: 0.2 % (ref 0.0–0.2)

## 2023-03-17 LAB — HEPATIC FUNCTION PANEL
ALT: 24 U/L (ref 0–44)
AST: 53 U/L — ABNORMAL HIGH (ref 15–41)
Albumin: 2.2 g/dL — ABNORMAL LOW (ref 3.5–5.0)
Alkaline Phosphatase: 82 U/L (ref 38–126)
Bilirubin, Direct: 0.8 mg/dL — ABNORMAL HIGH (ref 0.0–0.2)
Indirect Bilirubin: 1.2 mg/dL — ABNORMAL HIGH (ref 0.3–0.9)
Total Bilirubin: 2 mg/dL — ABNORMAL HIGH (ref 0.3–1.2)
Total Protein: 6.2 g/dL — ABNORMAL LOW (ref 6.5–8.1)

## 2023-03-17 LAB — PHOSPHORUS: Phosphorus: 3.6 mg/dL (ref 2.5–4.6)

## 2023-03-17 LAB — AMMONIA: Ammonia: 52 umol/L — ABNORMAL HIGH (ref 9–35)

## 2023-03-17 MED ORDER — ONDANSETRON 4 MG PO TBDP: 4.0000 mg | ORAL_TABLET | Freq: Four times a day (QID) | ORAL | Status: DC | PRN

## 2023-03-17 MED ORDER — DEXTROSE 50 % IV SOLN
INTRAVENOUS | Status: AC
  Filled 2023-03-17: qty 50

## 2023-03-17 MED ORDER — GLYCOPYRROLATE 1 MG PO TABS: 1.0000 mg | ORAL_TABLET | ORAL | Status: DC | PRN

## 2023-03-17 MED ORDER — POLYVINYL ALCOHOL 1.4 % OP SOLN: 1.0000 [drp] | Freq: Four times a day (QID) | OPHTHALMIC | Status: DC | PRN

## 2023-03-17 MED ORDER — GLYCOPYRROLATE 0.2 MG/ML IJ SOLN: 0.2000 mg | INTRAMUSCULAR | Status: DC | PRN

## 2023-03-17 MED ORDER — HALOPERIDOL LACTATE 2 MG/ML PO CONC: 0.5000 mg | ORAL | Status: DC | PRN

## 2023-03-17 MED ORDER — POTASSIUM CHLORIDE 20 MEQ PO PACK: 40.0000 meq | PACK | Freq: Once | ORAL | Status: DC

## 2023-03-17 MED ORDER — ACETAMINOPHEN 650 MG RE SUPP: 650.0000 mg | Freq: Four times a day (QID) | RECTAL | Status: DC | PRN

## 2023-03-17 MED ORDER — LORAZEPAM 2 MG/ML IJ SOLN: 1.0000 mg | INTRAMUSCULAR | Status: DC | PRN

## 2023-03-17 MED ORDER — LORAZEPAM 1 MG PO TABS: 1.0000 mg | ORAL_TABLET | ORAL | Status: DC | PRN

## 2023-03-17 MED ORDER — BIOTENE DRY MOUTH MT LIQD: 15.0000 mL | OROMUCOSAL | Status: DC | PRN

## 2023-03-17 MED ORDER — DEXTROSE 50 % IV SOLN
12.5000 g | INTRAVENOUS | Status: AC
  Administered 2023-03-17: 12.5 g via INTRAVENOUS

## 2023-03-17 MED ORDER — SODIUM CHLORIDE 0.9 % IV SOLN
3.0000 g | Freq: Four times a day (QID) | INTRAVENOUS | Status: DC
  Filled 2023-03-17 (×2): qty 8

## 2023-03-17 MED ORDER — MORPHINE SULFATE (PF) 2 MG/ML IV SOLN
2.0000 mg | INTRAVENOUS | Status: DC | PRN
  Administered 2023-03-17 – 2023-03-18 (×3): 2 mg via INTRAVENOUS
  Filled 2023-03-17 (×3): qty 1

## 2023-03-17 MED ORDER — HALOPERIDOL LACTATE 5 MG/ML IJ SOLN: 0.5000 mg | INTRAMUSCULAR | Status: DC | PRN

## 2023-03-17 MED ORDER — HALOPERIDOL 0.5 MG PO TABS: 0.5000 mg | ORAL_TABLET | ORAL | Status: DC | PRN

## 2023-03-17 MED ORDER — SODIUM BICARBONATE 8.4 % IV SOLN
50.0000 meq | Freq: Once | INTRAVENOUS | Status: AC
  Administered 2023-03-17: 50 meq via INTRAVENOUS
  Filled 2023-03-17: qty 50

## 2023-03-17 MED ORDER — SPIRITUS FRUMENTI
1.0000 | ORAL | Status: DC | PRN
  Administered 2023-03-17: 1 via ORAL
  Filled 2023-03-17 (×2): qty 1

## 2023-03-17 MED ORDER — LORAZEPAM 2 MG/ML PO CONC: 1.0000 mg | ORAL | Status: DC | PRN

## 2023-03-17 MED ORDER — ONDANSETRON HCL 4 MG/2ML IJ SOLN: 4.0000 mg | Freq: Four times a day (QID) | INTRAMUSCULAR | Status: DC | PRN

## 2023-03-17 MED ORDER — ACETAMINOPHEN 325 MG PO TABS: 650.0000 mg | ORAL_TABLET | Freq: Four times a day (QID) | ORAL | Status: DC | PRN

## 2023-03-17 MED ORDER — POTASSIUM CHLORIDE 10 MEQ/100ML IV SOLN
10.0000 meq | INTRAVENOUS | Status: DC
  Administered 2023-03-17 (×2): 10 meq via INTRAVENOUS
  Filled 2023-03-17 (×6): qty 100

## 2023-03-17 NOTE — Progress Notes (Signed)
PT Cancellation Note  Patient Details Name: MARSALIS BEAULIEU MRN: [id number] DOB: 08-06-1956   Cancelled Treatment:    Reason Eval/Treat Not Completed: Medical issues which prohibited therapy;Patient's level of consciousness Spoke with nurse who reports pt still too lethargic to participate, HGb <7, will maintain on caseload and attempt to see when pt is appropriate.   Malachi Pro, DPT 03/17/2023, 10:41 AM

## 2023-03-17 NOTE — TOC Progression Note (Addendum)
Transition of Care [facility]) - Progression Note    Patient Details  Name: JEKHI BOLIN MRN: [id number] Date of Birth: [birth year]  Transition of Care [facility]) CM/SW Contact  Kreg Shropshire, RN Phone Number: 03/17/2023, 2:45 PM  Clinical Narrative:     Cm received message from Janalyn Harder, NP in regards to family wanting pt to transition to comfort care, then hospice [facility] in [place]. Both patients sisters were in agreement to have their brother transition to comfort care. They stated they did not want to take him home because they could not care for him. Pt is going to be moved to palliative floor. Sisters stated they want to talk with [facility] liaison tomorrow about hospice bed options in [place]. Cm sent message to [facility] liaison to make outreach to family tomorrow.  [phone]-Jackie of [facility] stated he will f/u with sisters tomorrow.    Barriers to Discharge: Continued Medical Work up  Expected Discharge Plan and Services       Living arrangements for the past 2 months: Single Family Home                                       Social Determinants of Health (SDOH) Interventions SDOH Screenings   Tobacco Use: High Risk (04/19/2022)    Readmission Risk Interventions     No data to display

## 2023-03-17 NOTE — Progress Notes (Signed)
Tried calling report twice unable to get [facility] of nurse

## 2023-03-17 NOTE — Consult Note (Addendum)
Consultation Note Date: 03/17/2023   Patient Name: Mark Best  DOB: 1956/07/28  MRN: [id number]  Age / Sex: 67 y.o., male  PCP: [facility] Referring Physician: Gillis Santa, MD  Reason for Consultation: Establishing goals of care  HPI/Patient Profile: Per EMR, Mark Best is a 67 y.o. 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  History was obtained from his discharge.  Patient is alcoholic for past 20 years which has increased for past few years since his retirement.  Patient had some diarrhea and feeling fatigue and weak for the past week.  Patient did not answer phone call, so was discharged went to check on him and patient was found on the floor.  Clinical Assessment and Goals of Care: Notes and labs reviewed.  Spoke with the nursing outside room who advises family has been updated by care teams.  Staff advises patient has indicated shortly before my visit that he just wants comfort care.  In to see patient.  He is currently resting in bed with mittens in place.  His 2 sisters are at bedside.  He states he is not married and does not have children.  He states he lives at home alone.  With conversation regarding goals of care, discussed continued aggressive care versus comfort focused care.  Patient states he is ready to go.  Clarified that "ready to go" meant death, and he agrees.  He states he is tired.  He states he would like to leave the hospital.  Discussed hospice care and patient is in agreement.  Discussed comfort care, and patient states he does not want any more "damn needles".  He indicates he does not want any further sticks, pokes or prods, or any other life-prolonging care or medications.  He would like medications and other orders needed for comfort and dignity.   He request medication for anxiety and beer.  Discussed changes to orders.  Patient and family are in agreement.  Sisters discuss that they have been well updated and understand his  status.  They confirm that patient has stated to them that he just wants to be kept comfortable.  Sisters are in agreement for comfort care and discharge followed by hospice.  Nurse and attending team updated.  I completed a MOST form today with patient and his 2 sisters.  Each component of form was reviewed in detail.  One of his sisters on the form as he was unable to, and the signed original was placed in the chart. The form was scanned and sent to medical records for it to be uploaded under ACP tab in Epic. A photocopy was also placed in the chart to be scanned into EMR. The patient outlined their wishes for the following treatment decisions:  Cardiopulmonary Resuscitation: Do Not Attempt Resuscitation (DNR/No CPR)  Medical Interventions: Comfort Measures: Keep clean, warm, and dry. Use medication by any route, positioning, wound care, and other measures  to relieve pain and suffering. Use oxygen, suction and manual treatment of airway obstruction as needed for comfort. Do not transfer to the hospital unless comfort needs cannot be met in current location.  Antibiotics: No antibiotics (use other measures to relieve symptoms)  IV Fluids: No IV fluids (provide other measures to ensure comfort)  Feeding Tube: No feeding tube      SUMMARY OF RECOMMENDATIONS   Patient currently on comfort care.  Given potential symptom management needs, would recommend hospice facility placement.  Prognosis:  <2 weeks      Primary Diagnoses: Present on Admission: **None**   I have reviewed the medical record, interviewed the patient and family, and examined the patient. The following aspects are pertinent.  Past Medical History:  Diagnosis Date   Arthritis    SOB (shortness of breath)    Social History   Socioeconomic  History   Marital status: Divorced    Spouse name: Not on file   Number of children: Not on file   Years of education: Not on file   Highest education level: Not on file  Occupational History   Not on file  Tobacco Use   Smoking status: Every Day    Current packs/day: 0.50    Types: Cigarettes   Smokeless tobacco: Never  Vaping Use   Vaping status: Never Used  Substance and Sexual Activity   Alcohol use: Not Currently    Alcohol/week: 56.0 standard drinks of alcohol    Types: 56 Shots of liquor per week   Drug use: Not Currently   Sexual activity: Not on file  Other Topics Concern   Not on file  Social History Narrative   Not on file   Social Determinants of Health   Financial Resource Strain: Not on file  Food Insecurity: Not on file  Transportation Needs: Not on file  Physical Activity: Not on file  Stress: Not on file  Social Connections: Not on file   No family history on file. Scheduled Meds:  Chlorhexidine Gluconate Cloth  6 each Topical Daily   midodrine  10 mg Oral TID WC   sodium chloride flush  3 mL Intravenous Q12H   Continuous Infusions: PRN Meds:.acetaminophen **OR** acetaminophen, antiseptic oral rinse, glycopyrrolate **OR** glycopyrrolate **OR** glycopyrrolate, haloperidol **OR** haloperidol **OR** haloperidol lactate, LORazepam **OR** LORazepam **OR** LORazepam, LORazepam **OR** LORazepam, morphine injection, ondansetron **OR** ondansetron (ZOFRAN) IV, polyvinyl alcohol, spiritus frumenti Medications Prior to Admission:  Prior to Admission medications   Medication Sig Start Date End Date Taking? Authorizing Provider  folic acid (FOLVITE) 1 MG tablet Take 1 mg by mouth daily. Patient not taking: Reported on 03/15/2023 01/05/22   [provider]  promethazine (PHENERGAN) 25 MG tablet Take 25 mg by mouth every 4 (four) hours as needed. Patient not taking: Reported on 03/15/2023 01/01/22   [provider]  traZODone (DESYREL) 50 MG tablet  PLEASE SEE ATTACHED FOR DETAILED DIRECTIONS Patient not taking: Reported on 03/15/2023 01/01/22   [provider]   Allergies  Allergen Reactions   Azithromycin Rash   Review of Systems  All other systems reviewed and are negative.   Physical Exam Pulmonary:     Effort: Pulmonary effort is normal.  Neurological:     Mental Status: He is alert.     Vital Signs: BP 90/65 (BP Location: Left Arm)   Pulse 73   Temp 98.1 F (36.7 C) (Oral)   Resp 16   Ht 5\' 11"  (1.803 m)   Wt 57.4 kg  SpO2 99%   BMI 17.65 kg/m  Pain Scale: CPOT       SpO2: SpO2: 99 % O2 Device:SpO2: 99 % O2 Flow Rate: .   IO: Intake/output summary:  Intake/Output Summary (Last 24 hours) at 03/17/2023 1424 Last data filed at 03/17/2023 0800 Gross per 24 hour  Intake 2203.75 ml  Output 200 ml  Net 2003.75 ml    LBM: Last BM Date : 03/15/23 Baseline Weight: Weight: 59.4 kg Most recent weight: Weight: 57.4 kg      Signed by: Morton Stall, NP   Please contact Palliative Medicine Team phone at [phone] for questions and concerns.  For individual provider: See Loretha Stapler

## 2023-03-17 NOTE — Progress Notes (Signed)
[facility] Progress Note  Patient: Mark Best    ZOX:[id number]  DOA: 03/15/2023     Date of Service: the patient was seen and examined on 03/17/2023  Chief Complaint  Patient presents with   Fall   Altered Mental Status   Brief hospital course: Mark Best is a 67 y.o. 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  History was obtained from his discharge.  Patient is alcoholic for past 20 years which has increased for past few years since his retirement.  Patient had some diarrhea and feeling fatigue and weak for the past week.  Patient did not answer phone call, so was discharged went to check on him and patient was found on the floor. ED workup: VS stable, saturating well on room air Creatinine 2.35, bicarb 19, glucose 135, albumin 1.5, CK1 38, troponin 39 slightly elevated, lactic acid 3.6, WBC 14.2, hemoglobin 8.9 COVID PCR negative, EtOH level less than 10 CT head negative for any acute findings CXR: Moderate, layering right pleural effusion and associated atelectasis or consolidation. Left lung normally aerated. CT a/p: Moderate RIGHT pleural effusion. Consolidation of the RIGHT LOWER lobe.  A 4 millimeter nodule in the LEFT LOWER lobe. Solid pulmonary nodule measuring 4 mm. Per Fleischner Society Guidelines, no routine follow-up imaging is recommended.  Severe hepatic steatosis.  2.2 centimeter hypodense lesion and 2.1 centimeter hyperdense lesion adjacent to the falciform ligament. Recommend further evaluation with MRI of the abdomen with and without contrast. Distended gallbladder.  Ascites.  Aortic Atherosclerosis     Assessment and Plan:  Aspiration pneumonia with pleural effusion S/p vancomycin and cefepime S/p Unasyn, DC'd due to comfort measures Continue supplemental O2 admission as needed Continue aspiration precautions  Parapneumonic pleural  effusion CT scan shows moderate right pleural effusion as above IR consulted for thoracentesis discontinued due to comfort measures  Hypotension most likely due to dehydration and AMS, decreased sympathetic activity S/p LR bolus given and maintenance IV fluids LR 75 Avoided too much IV fluid due to ascites and elevated BNP S/p IV albumin 12.5 g every 6 hourly x 3 doses given Started midodrine 10 mg p.o. 3 times daily when patient will be able to take oral meds  AKI, baseline creatinine 0.81 Creatinine 2.35 on admission most likely due to dehydration No urinary retention on ultrasound Cr 1.6 is improved after IV fluid given for hydration. Stopped labs due to comfort measures only Metabolic acidosis, s/p bicarb IV push given, patient is unable to take oral bicarb  Liver cirrhosis with ascites due to EtOH Acute metabolic encephalopathy, Ammonia level within normal range CT scan reviewed as above, patient has liver lesion, needs MRI, recommend to follow as an outpatient with PCP when patient is more stable Child's Pugh score 12 points, class C, 1 to 3 years mortality INR 4.0 elevated due to liver cirrhosis. S/p Vitamin K 5 mg IV x 1 dose Slightly elevated LFTs, continue to monitor   Demand ischemia, troponin slightly elevated Patient denies any chest pain. Elevated BNP TTE LVEF 70 to 75%, mild LV hypertrophy,   EtOH abuse, metabolic encephalopathy possible Wernicke's and malnutrition Continue multivitamin, thiamine and folic acid Continue to monitor for withdrawal symptoms  Macrocytic anemia of chronic disease and due to liver cirrhosis Iron 51, transferrin ratio could not be measured, folate within normal range, B12 elevated 7/17 Hb 6.6 dropped, no obvious bleeding.  Discussed with patient's  sister regarding transfusion, they refused blood transfusion and started comfort measures only.  Abnormal TSH level, TSH 13.25,  Free T4 level 1.23 elevated Continue comfort measures  Body  mass index is 17.65 kg/m.  Interventions:  Diet: NPO  DVT Prophylaxis: SCD, pharmacological prophylaxis contraindicated due to INR elevated     Advance goals of care discussion: DNR  Family Communication: family was present at bedside, at the time of interview.  Overall poor prognosis, patient has significant metabolic encephalopathy and unable to communicate.  Disposition:  Pt is from Home, admitted with pneumonia, metabolic encephalopathy, liver cirrhosis, AKI and dehydration.due to poor prognosis, palliative care was consulted, patient was transition to comfort measures on 03/17/2023.  TOC was informed to consult hospice.  Awaiting for hospice facility placement.   Patient can be discharged to hospice when bed will be available.   Subjective: No significant overnight events, patient was very lethargic and confused AAO x 2, knows his name and date of birth, knows what month and year is it, does not know location and his condition what happened to him. Patient's sisters were present at bedside, condition explained to patient and suggested comfort measures only.  Patient and her both sisters agreed with the plan.  Palliative care was consulted.  Patient was transition to comfort measures only and awaiting for hospice placement now.  Physical Exam: General: NAD, lying comfortably Appear in no distress, affect appropriate Eyes: PERRLA ENT: Oral Mucosa Clear, dry Neck: no JVD,  Cardiovascular: S1 and S2 Present, no Murmur,  Respiratory: good respiratory effort, Bilateral Air entry equal and Decreased, no Crackles, no wheezes Abdomen: Bowel Sound present, Soft and no tenderness,  Skin: no rashes Extremities: no Pedal edema, no calf tenderness Neurologic: without any new focal findings Gait not checked due to patient safety concerns  Vitals:   03/17/23 1100 03/17/23 1200 03/17/23 1300 03/17/23 1400  BP: 99/70 90/65 103/68 (!) 81/59  Pulse: 92 73 78 (!) 59  Resp:      Temp:       TempSrc:      SpO2: 100% 99% 100% 100%  Weight:      Height:        Intake/Output Summary (Last 24 hours) at 03/17/2023 1444 Last data filed at 03/17/2023 1400 Gross per 24 hour  Intake 2793.45 ml  Output 200 ml  Net 2593.45 ml   Filed Weights   03/15/23 1240 03/15/23 1700  Weight: 59.4 kg 57.4 kg    Data Reviewed: I have personally reviewed and interpreted daily labs, tele strips, imagings as discussed above. I reviewed all nursing notes, pharmacy notes, vitals, pertinent old records I have discussed plan of care as described above with RN and patient/family.  CBC: Recent Labs  Lab 03/15/23 1316 03/16/23 0534 03/17/23 0339  WBC 14.2* 12.9* 11.7*  NEUTROABS 12.1*  --   --   HGB 8.9* 7.4* 6.6*  HCT 26.2* 22.1* 19.7*  MCV 99.6 102.3* 100.5*  PLT 164 147* 139*   Basic Metabolic Panel: Recent Labs  Lab 03/15/23 1316 03/15/23 1826 03/16/23 0534 03/17/23 0339  NA 138  --  138 141  K 4.7  --  3.5 2.8*  CL 107  --  110 111  CO2 19*  --  19* 18*  GLUCOSE 135*  --  89 81  BUN 29*  --  28* 26*  CREATININE 2.35*  --  2.09* 1.66*  CALCIUM 8.0*  --  7.9* 8.2*  MG  --  2.2  --  1.9  PHOS  --  3.5  --  3.6    Studies: [place] EKG SITE RITE  Result Date: 03/16/2023 If Site Rite image not attached, placement could not be confirmed due to current cardiac rhythm.   Scheduled Meds:  Chlorhexidine Gluconate Cloth  6 each Topical Daily   midodrine  10 mg Oral TID WC   sodium chloride flush  3 mL Intravenous Q12H   Continuous Infusions:   PRN Meds: acetaminophen **OR** acetaminophen, antiseptic oral rinse, glycopyrrolate **OR** glycopyrrolate **OR** glycopyrrolate, haloperidol **OR** haloperidol **OR** haloperidol lactate, LORazepam **OR** LORazepam **OR** LORazepam, LORazepam **OR** LORazepam, morphine injection, ondansetron **OR** ondansetron (ZOFRAN) IV, polyvinyl alcohol, spiritus frumenti  Time spent: 55 minutes  Author: Gillis Santa. MD [facility] 03/17/2023  2:44 PM  To reach On-call, see care teams to locate the attending and reach out to them via [email or web address]. If 7PM-7AM, please contact night-coverage If you still have difficulty reaching the attending provider, please page the [facility] (Director on Call) for [facility] on amion for assistance.

## 2023-03-17 NOTE — Consult Note (Signed)
PHARMACY NOTE:  ANTIMICROBIAL RENAL DOSAGE ADJUSTMENT  Current antimicrobial regimen includes a mismatch between antimicrobial dosage and estimated renal function.  As per policy approved by the Pharmacy & Therapeutics and Medical Executive Committees, the antimicrobial dosage will be adjusted accordingly.  Current antimicrobial dosage:  Unaysn 3 g q12H  Indication: Asp PNA  Renal Function:  Estimated Creatinine Clearance: 35.1 mL/min (A) (by C-G formula based on SCr of 1.66 mg/dL (H)). []      On intermittent HD, scheduled: []      On CRRT    Antimicrobial dosage has been changed to:  Unasyn 3 g q6H  Additional comments:   Thank you for allowing pharmacy to be a part of this patient's care.  Ronnald Ramp, [facility] 03/17/2023 5:06 AM

## 2023-03-17 NOTE — Progress Notes (Signed)
Spoke with Morton Stall, NP with palliative care. Received confirmation this patient is now comfort care only, and ok to cancel PICC order. RN aware.

## 2023-03-17 NOTE — Progress Notes (Signed)
 Nutrition Brief Note  Chart reviewed. Pt now transitioning to comfort care.  No further nutrition interventions planned at this time.  Please re-consult as needed.   Jenifer W, RD, LDN, CDCES Registered Dietitian II Certified Diabetes Care and Education Specialist Please refer to AMION for RD and/or RD on-call/weekend/after hours pager   

## 2023-03-17 NOTE — Plan of Care (Signed)
  Problem: Clinical Measurements: Goal: Will remain free from infection Outcome: Progressing Goal: Respiratory complications will improve Outcome: Progressing Goal: Cardiovascular complication will be avoided Outcome: Progressing   Problem: Pain Managment: Goal: General experience of comfort will improve Outcome: Progressing   Problem: Activity: Goal: Risk for activity intolerance will decrease Outcome: Not Progressing   Problem: Nutrition: Goal: Adequate nutrition will be maintained Outcome: Not Progressing

## 2023-03-18 DIAGNOSIS — J690 Pneumonitis due to inhalation of food and vomit: Secondary | ICD-10-CM | POA: Diagnosis not present

## 2023-03-18 DIAGNOSIS — Z7189 Other specified counseling: Secondary | ICD-10-CM | POA: Diagnosis not present

## 2023-03-18 NOTE — Progress Notes (Signed)
Report called to Hotel manager at [facility] house. All questions answered.

## 2023-03-18 NOTE — Discharge Summary (Signed)
[facility] Discharge Summary   Patient: Mark Best ZOX:[id number]  PCP: [facility], [facility]  Date of admission: 03/15/2023   Date of discharge:  03/18/2023     Discharge Diagnoses:  Principal Problem:   Aspiration pneumonia (HCC) Active Problems:   Parapneumonic effusion   Acute encephalopathy   Alcohol use disorder   Elevated LFTs   AKI (acute kidney injury) (HCC)   Normocytic anemia   Demand ischemia   Admitted From: Home    Disposition: Hospice medical facility  Recommendations for Outpatient Follow-up:  Hospice care Follow up LABS/TEST: None   Diet recommendation: NPO, comfort care, unable to swallow  Activity: The patient is advised to gradually reintroduce usual activities, as tolerated  Discharge Condition: stable  Code Status: DNR   History of present illness: As per the H and P dictated on admission Hospital Course:  Mark Best is a 67 y.o. 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  History was obtained from his discharge.  Patient is alcoholic for past 20 years which has increased for past few years since his retirement.  Patient had some diarrhea and feeling fatigue and weak for the past week.  Patient did not answer phone call, so was discharged went to check on him and patient was found on the floor. ED workup: VS stable, saturating well on room air Creatinine 2.35, bicarb 19, glucose 135, albumin 1.5, CK1 38, troponin 39 slightly elevated, lactic acid 3.6, WBC 14.2, hemoglobin 8.9. COVID PCR negative, EtOH level <10 CT head negative for any acute findings CXR: Moderate, layering right pleural effusion and associated atelectasis or consolidation. Left lung normally aerated. CT a/p: Moderate RIGHT pleural effusion. Consolidation of the RIGHT LOWER lobe.  A 4 millimeter nodule in the LEFT LOWER lobe. Solid pulmonary nodule  measuring 4 mm. Per Fleischner Society Guidelines, no routine follow-up imaging is recommended.  Severe hepatic steatosis.  2.2 centimeter hypodense lesion and 2.1 centimeter hyperdense lesion adjacent to the falciform ligament. Recommend further evaluation with MRI of the abdomen with and without contrast. Distended gallbladder.  Ascites.  Aortic Atherosclerosis    Assessment and Plan: # Aspiration pneumonia with pleural effusion S/p vancomycin and cefepime and S/p Unasyn, DC'd due to comfort measures Continue supplemental O2 inhalation for comfort care # Parapneumonic pleural effusion: CT scan shows moderate right pleural effusion as above IR consulted for thoracentesis discontinued due to comfort measures # Hypotension most likely due to dehydration and AMS, decreased sympathetic activity S/p LR bolus given and s/p maintenance IV fluids LR 75. Avoided too much IV fluid due to ascites and elevated BNP. S/p IV albumin 12.5 g every 6 hourly x 3 doses given. S/p midodrine 10 mg p.o. TID order placed but patient is unable to swallow.  Continue comfort measures. # AKI, baseline creatinine 0.81, Creatinine 2.35 on admission most likely due to dehydration No urinary retention on ultrasound. Cr 1.6 is improved after IV fluid given for hydration. Stopped labs due to comfort measures only. Metabolic acidosis, s/p bicarb IV push given, patient is unable to take oral bicarb # Liver cirrhosis with ascites due to EtOH # Acute metabolic encephalopathy, Ammonia level within normal range CT scan reviewed as above, patient has liver lesion, needs MRI, recommend to follow as an outpatient with PCP when patient is more stable. Child's Pugh score 12 points, class C, 1 to 3 years mortality. INR 4.0 elevated due  to liver cirrhosis. S/p Vitamin K 5 mg IV x 1 dose. Slightly elevated LFTs.  No need of monitoring now due to comfort measures only. # Demand ischemia, troponin slightly elevated. Patient denied any chest pain.  Elevated BNP TTE LVEF 70 to 75%, mild LV hypertrophy.  Continue comfort measures # EtOH abuse, metabolic encephalopathy possible Wernicke's and malnutrition. S/p multivitamin, thiamine and folic acid. S/p CIWA protocol. 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 Iron 51, transferrin ratio could not be measured, folate within normal range, B12 elevated. On 7/17 Hb 6.6 dropped, no obvious bleeding.  Discussed with patient's sister regarding transfusion, they refused blood transfusion and started comfort measures only. # Abnormal TSH level, TSH 13.25,  Free T4 level 1.23 elevated. Continue comfort measures  Body mass index is 17.65 kg/m.  Nutrition Interventions:  Pressure Injury 03/16/23 Elbow Left;Posterior Stage 2 -  Partial thickness loss of dermis presenting as a shallow open injury with a red, pink wound bed without slough. 4cm x 3cm Right elbow open area with yellowish drainage . (Active)  03/16/23 1800  Location: Elbow  Location Orientation: Left;Posterior  Staging: Stage 2 -  Partial thickness loss of dermis presenting as a shallow open injury with a red, pink wound bed without slough.  Wound Description (Comments): 4cm x 3cm Right elbow open area with yellowish drainage .  Present on Admission: Yes  Dressing Type Foam - Lift dressing to assess site every shift 03/17/23 2100     Pressure Injury 03/16/23 Elbow Posterior;Right Stage 2 -  Partial thickness loss of dermis presenting as a shallow open injury with a red, pink wound bed without slough. 7cm x 6cm open skin with reddend and some yellowish  drainage (Active)  03/16/23 1800  Location: Elbow  Location Orientation: Posterior;Right  Staging: Stage 2 -  Partial thickness loss of dermis presenting as a shallow open injury with a red, pink wound bed without slough.  Wound Description (Comments): 7cm x 6cm open skin with reddend and some yellowish  drainage  Present on Admission: Yes  Dressing Type Foam - Lift  dressing to assess site every shift 03/17/23 2100     Overall poor prognosis, patient was transition to comfort measures only.  Patient got accepted to hospice facility today, we will go ahead and discharge him to the hospice services.  Consultants: Palliative care and hospice Procedures: None  Discharge Exam: General: Appear in no distress, no Rash; mouth closed, lips dry Cardiovascular: S1 and S2 Present, no Murmur, Respiratory: Equal air entry bilaterally, mild conducting sounds Abdomen: Bowel Sound present, Soft and no tenderness, no hernia Extremities: no Pedal edema, no calf tenderness Neurology: Sleeping comfortably, very lethargic unable to arouse.  Received morphine in the morning  Filed Weights   03/15/23 1240 03/15/23 1700  Weight: 59.4 kg 57.4 kg   Vitals:   03/17/23 1749 03/18/23 1202  BP: 94/66 (!) 125/57  Pulse: (!) 50 72  Resp: (!) 21 20  Temp:  (!) 97.5 F (36.4 C)  SpO2: 97% 90%    DISCHARGE MEDICATION: Allergies as of 03/18/2023       Reactions   Azithromycin Rash        Medication List     STOP taking these medications    folic acid 1 MG tablet Commonly known as: FOLVITE   promethazine 25 MG tablet Commonly known as: PHENERGAN   traZODone 50 MG tablet Commonly known as: DESYREL  Discharge Care Instructions  (From admission, onward)           Start     Ordered   03/18/23 0000  Discharge wound care:       Comments: Ase above   03/18/23 1308           Allergies  Allergen Reactions   Azithromycin Rash   Discharge Instructions     Diet - low sodium heart healthy   Complete by: As directed    Discharge wound care:   Complete by: As directed    Ase above   Increase activity slowly   Complete by: As directed        The results of significant diagnostics from this hospitalization (including imaging, microbiology, ancillary and laboratory) are listed below for reference.    Significant Diagnostic  Studies: [place] EKG SITE RITE  Result Date: 03/16/2023 If Site Rite image not attached, placement could not be confirmed due to current cardiac rhythm.  ECHOCARDIOGRAM COMPLETE  Result Date: 03/16/2023    ECHOCARDIOGRAM REPORT   Patient Name:   FERDIE BAKKEN Date of Exam: 03/16/2023 Medical Rec #:  [id number]      Height:       71.0 in Accession #:    [id number]     Weight:       126.5 lb Date of Birth:  [birth year]     BSA:          1.736 m Patient Age:    73 years       BP:           96/61 mmHg Patient Gender: M              HR:           65 bpm. Exam Location:  [facility] Procedure: 2D Echo, Cardiac Doppler and Color Doppler Indications:     Elevated Troponin  History:         Patient has no prior history of Echocardiogram examinations.                  ETOH use.  Sonographer:     Mikki Harbor Referring Phys:  [id number] Gillis Santa Diagnosing Phys: Yvonne Kendall MD  Sonographer Comments: Technically difficult study due to poor echo windows and suboptimal parasternal window. Image acquisition challenging due to patient body habitus, Image acquisition challenging due to respiratory motion and Image acquisition challenging due to patient behavioral factors. IMPRESSIONS  1. Left ventricular ejection fraction, by estimation, is 70 to 75%. The left ventricle has hyperdynamic function. Left ventricular endocardial border not optimally defined to evaluate regional wall motion. There is mild left ventricular hypertrophy. Left ventricular diastolic parameters were normal.  2. Right ventricular systolic function is normal. The right ventricular size is normal. There is mildly elevated pulmonary artery systolic pressure.  3. The mitral valve is grossly normal. No evidence of mitral valve regurgitation. No evidence of mitral stenosis.  4. The aortic valve was not well visualized. Aortic valve regurgitation is not visualized. No aortic stenosis is present. FINDINGS  Left Ventricle: Left ventricular ejection fraction, by  estimation, is 70 to 75%. The left ventricle has hyperdynamic function. Left ventricular endocardial border not optimally defined to evaluate regional wall motion. The left ventricular internal cavity size was normal in size. There is mild left ventricular hypertrophy. Left ventricular diastolic parameters were normal. Right Ventricle: The right ventricular size is normal. No increase in right ventricular wall thickness. Right ventricular systolic function is normal.  There is mildly elevated pulmonary artery systolic pressure. The tricuspid regurgitant velocity is 2.82  m/s, and with an assumed right atrial pressure of 8 mmHg, the estimated right ventricular systolic pressure is 39.8 mmHg. Left Atrium: Left atrial size was normal in size. Right Atrium: Right atrial size was normal in size. Pericardium: There is no evidence of pericardial effusion. Mitral Valve: The mitral valve is grossly normal. No evidence of mitral valve regurgitation. No evidence of mitral valve stenosis. MV peak gradient, 3.4 mmHg. The mean mitral valve gradient is 1.0 mmHg. Tricuspid Valve: The tricuspid valve is not well visualized. Tricuspid valve regurgitation is trivial. Aortic Valve: The aortic valve was not well visualized. Aortic valve regurgitation is not visualized. No aortic stenosis is present. Aortic valve mean gradient measures 2.0 mmHg. Aortic valve peak gradient measures 5.0 mmHg. Aortic valve area, by VTI measures 2.67 cm. Pulmonic Valve: The pulmonic valve was not well visualized. Pulmonic valve regurgitation is not visualized. No evidence of pulmonic stenosis. Aorta: The aortic root is normal in size and structure. Pulmonary Artery: The pulmonary artery is not well seen. IAS/Shunts: The interatrial septum was not well visualized. Additional Comments: Mild ascites is present.  LEFT VENTRICLE PLAX 2D LVIDd:         3.90 cm   Diastology LVIDs:         2.60 cm   LV e' medial:    10.70 cm/s LV PW:         1.30 cm   LV E/e'  medial:  6.8 LV IVS:        1.20 cm   LV e' lateral:   11.70 cm/s LVOT diam:     1.90 cm   LV E/e' lateral: 6.2 LV SV:         66 LV SV Index:   38 LVOT Area:     2.84 cm  RIGHT VENTRICLE RV Basal diam:  3.40 cm RV Mid diam:    3.30 cm RV S prime:     13.80 cm/s TAPSE (M-mode): 2.6 cm LEFT ATRIUM           Index        RIGHT ATRIUM           Index LA diam:      3.10 cm 1.79 cm/m   RA Area:     16.90 cm LA Vol (A4C): 30.1 ml 17.34 ml/m  RA Volume:   47.30 ml  27.25 ml/m  AORTIC VALVE AV Area (Vmax):    2.71 cm AV Area (Vmean):   2.39 cm AV Area (VTI):     2.67 cm AV Vmax:           112.00 cm/s AV Vmean:          69.400 cm/s AV VTI:            0.246 m AV Peak Grad:      5.0 mmHg AV Mean Grad:      2.0 mmHg LVOT Vmax:         107.00 cm/s LVOT Vmean:        58.500 cm/s LVOT VTI:          0.232 m LVOT/AV VTI ratio: 0.94  AORTA Ao Root diam: 3.00 cm MITRAL VALVE               TRICUSPID VALVE MV Area (PHT): 2.83 cm    TR Peak grad:   31.8 mmHg MV Area VTI:   2.25 cm  TR Vmax:        282.00 cm/s MV Peak grad:  3.4 mmHg MV Mean grad:  1.0 mmHg    SHUNTS MV Vmax:       0.92 m/s    Systemic VTI:  0.23 m MV Vmean:      51.0 cm/s   Systemic Diam: 1.90 cm MV Decel Time: 268 msec MV E velocity: 72.80 cm/s MV A velocity: 77.00 cm/s MV E/A ratio:  0.95 Cristal Deer End MD Electronically signed by Yvonne Kendall MD Signature Date/Time: 03/16/2023/4:13:52 PM    Final    US PELVIS LIMITED (TRANSABDOMINAL ONLY)  Result Date: 03/16/2023 CLINICAL DATA:  Acute kidney injury. EXAM: LIMITED ULTRASOUND OF PELVIS TECHNIQUE: Limited transabdominal ultrasound examination of the pelvis was performed. COMPARISON:  None Available. FINDINGS: Mild to moderate amount of ascites is noted in the visualized pelvis. Urinary bladder is unremarkable with calculated prevoid volume of 18 mL. Exam is limited as patient did not cooperate with positioning or commands. IMPRESSION: Mild to moderate ascites seen in the pelvis. Electronically Signed    By: Lupita Raider M.D.   On: 03/16/2023 10:38   US Abdomen Complete  Result Date: 03/16/2023 CLINICAL DATA:  Acute kidney injury.  Elevated liver function tests. EXAM: ABDOMEN ULTRASOUND COMPLETE COMPARISON:  February 17, 2022. FINDINGS: Gallbladder: No gallstones or wall thickening visualized. No sonographic Murphy sign noted by sonographer. Common bile duct: Diameter: 4 mm which is within normal limits. Liver: Increased echogenicity of hepatic parenchyma is noted suggesting hepatic steatosis. 2.1 cm cyst is seen in right hepatic lobe. Portal vein is patent on color Doppler imaging with normal direction of blood flow towards the liver. IVC: No abnormality visualized. Pancreas: Visualized portion unremarkable. Spleen: Size and appearance within normal limits. Right Kidney: Not visualized due to patient not cooperating with positioning. Left Kidney: Length: 10.5 cm. Echogenicity within normal limits. No mass or hydronephrosis visualized. Abdominal aorta: Not visualized due to patient not cooperating with positioning. Other findings: Minimal ascites is noted. IMPRESSION: Right kidney and abdominal aorta not visualized due to patient not cooperating with positioning. Hepatic steatosis. Right hepatic cyst. Minimal ascites. Electronically Signed   By: Lupita Raider M.D.   On: 03/16/2023 10:36   CT CHEST WO CONTRAST  Result Date: 03/15/2023 CLINICAL DATA:  Pleural effusion. Nondiagnostic x-ray. Fall. Altered mental status. EXAM: CT CHEST WITHOUT CONTRAST TECHNIQUE: Multidetector CT imaging of the chest was performed following the standard protocol without IV contrast. RADIATION DOSE REDUCTION: This exam was performed according to the departmental dose-optimization program which includes automated exposure control, adjustment of the mA and/or kV according to patient size and/or use of iterative reconstruction technique. COMPARISON:  Chest x-ray 03/15/2023, CT of the chest abdomen and pelvis on 01/14/2022 FINDINGS:  Cardiovascular: Heart size is normal. There is no pericardial effusion. There is coronary artery calcification. The thoracic aorta is not aneurysmal but is partially calcified. Normal appearance of the noncontrast pulmonary arteries. Mediastinum/Nodes: The visualized portion of the thyroid gland has a normal appearance. Esophagus is unremarkable. No significant mediastinal, hilar, or axillary adenopathy. Lungs/Pleura: Moderate RIGHT pleural effusion. There is consolidation of the RIGHT LOWER lobe. Airways are patent. There is a 4 millimeter nodule within the superior segment of the LEFT LOWER lobe. Upper Abdomen: There is marked fatty infiltration of the liver. Oval mass adjacent to the gallbladder fossa is 2.2 centimeters (image 154 of series 2). There is a 2.0 centimeter hyperdensity adjacent to the falciform ligament, possibly related to heterogeneous fatty infiltration (  image 158 of series 2). Lesion is not excluded. Gallbladder is distended. Perihepatic ascites. Musculoskeletal: Moderate degenerative changes throughout the thoracic spine. No acute fractures. IMPRESSION: 1. Moderate RIGHT pleural effusion. 2. Consolidation of the RIGHT LOWER lobe. 3. A 4 millimeter nodule in the LEFT LOWER lobe. Solid pulmonary nodule measuring 4 mm. Per Fleischner Society Guidelines, no routine follow-up imaging is recommended. These guidelines do not apply to immunocompromised patients and patients with cancer. Follow up in patients with significant comorbidities as clinically warranted. For lung cancer screening, adhere to Lung-RADS guidelines. Reference: Radiology. 2017; 284(1):228-43. 4. Severe hepatic steatosis. 5. 2.2 centimeter hypodense lesion and 2.1 centimeter hyperdense lesion adjacent to the falciform ligament. Recommend further evaluation with MRI of the abdomen with and without contrast. 6. Distended gallbladder. 7. Ascites. 8.  Aortic Atherosclerosis (ICD10-I70.0). Electronically Signed   By: Norva Pavlov  M.D.   On: 03/15/2023 16:48   CT Head Wo Contrast  Result Date: 03/15/2023 CLINICAL DATA:  Altered mental status EXAM: CT HEAD WITHOUT CONTRAST TECHNIQUE: Contiguous axial images were obtained from the base of the skull through the vertex without intravenous contrast. RADIATION DOSE REDUCTION: This exam was performed according to the departmental dose-optimization program which includes automated exposure control, adjustment of the mA and/or kV according to patient size and/or use of iterative reconstruction technique. COMPARISON:  04/19/2022 FINDINGS: There is patient motion in many of the images. Additional images were repeated. Brain: No acute intracranial findings are seen. There are no signs of bleeding within the cranium. Cortical sulci are prominent. There is decreased density in periventricular and subcortical white matter. Vascular: Unremarkable. Skull: No displaced fracture is seen. Sinuses/Orbits: There is mucosal thickening in sphenoid sinus. Other: None. IMPRESSION: Motion limited study. No acute intracranial findings are seen. Atrophy. Small-vessel disease. Mild chronic sphenoid sinusitis. Electronically Signed   By: Ernie Avena M.D.   On: 03/15/2023 14:28   DG Chest Port 1 View  Result Date: 03/15/2023 CLINICAL DATA:  Weakness EXAM: PORTABLE CHEST 1 VIEW COMPARISON:  12/06/2012 FINDINGS: Rotated AP portable examination. Heart size is normal. Moderate, layering right pleural effusion and associated atelectasis or consolidation. Left lung normally aerated. Osseous structures unremarkable. IMPRESSION: Moderate, layering right pleural effusion and associated atelectasis or consolidation. Left lung normally aerated. Electronically Signed   By: Jearld Lesch M.D.   On: 03/15/2023 13:56    Microbiology: Recent Results (from the past 240 hour(s))  SARS Coronavirus 2 by RT PCR (hospital order, performed in [facility] hospital lab) *cepheid single result test* Anterior Nasal Swab      Status: None   Collection Time: 03/15/23  1:16 PM   Specimen: Anterior Nasal Swab  Result Value Ref Range Status   SARS Coronavirus 2 by RT PCR NEGATIVE NEGATIVE Final    Comment: (NOTE) SARS-CoV-2 target nucleic acids are NOT DETECTED.  The SARS-CoV-2 RNA is generally detectable in upper and lower respiratory specimens during the acute phase of infection. The lowest concentration of SARS-CoV-2 viral copies this assay can detect is 250 copies / mL. A negative result does not preclude SARS-CoV-2 infection and should not be used as the sole basis for treatment or other patient management decisions.  A negative result may occur with improper specimen collection / handling, submission of specimen other than nasopharyngeal swab, presence of viral mutation(s) within the areas targeted by this assay, and inadequate number of viral copies (<250 copies / mL). A negative result must be combined with clinical observations, patient history, and epidemiological information.  Fact Sheet  for Patients:   RoadLapTop.co.za  Fact Sheet for Healthcare Providers: http://kim-miller.com/  This test is not yet approved or  cleared by the [place] FDA and has been authorized for detection and/or diagnosis of SARS-CoV-2 by FDA under an Emergency Use Authorization (EUA).  This EUA will remain in effect (meaning this test can be used) for the duration of the COVID-19 declaration under Section 564(b)(1) of the Act, 21 U.S.C. section 360bbb-3(b)(1), unless the authorization is terminated or revoked sooner.  Performed at [facility], [address] [place], [place] [address]   Blood Culture (routine x 2)     Status: None (Preliminary result)   Collection Time: 03/15/23  2:38 PM   Specimen: BLOOD  Result Value Ref Range Status   Specimen Description BLOOD BLOOD LEFT FOREARM  Final   Special Requests   Final    BOTTLES DRAWN AEROBIC AND ANAEROBIC  Blood Culture results may not be optimal due to an inadequate volume of blood received in culture bottles   Culture   Final    NO GROWTH 3 DAYS Performed at [facility], [address] [place], [place] [address]    Report Status PENDING  Incomplete  Blood Culture (routine x 2)     Status: None (Preliminary result)   Collection Time: 03/15/23  2:43 PM   Specimen: BLOOD  Result Value Ref Range Status   Specimen Description BLOOD BLOOD LEFT WRIST  Final   Special Requests   Final    BOTTLES DRAWN AEROBIC AND ANAEROBIC Blood Culture results may not be optimal due to an inadequate volume of blood received in culture bottles   Culture   Final    NO GROWTH 3 DAYS Performed at [facility], [address] [place], [place] [address]    Report Status PENDING  Incomplete     Labs: CBC: Recent Labs  Lab 03/15/23 1316 03/16/23 0534 03/17/23 0339  WBC 14.2* 12.9* 11.7*  NEUTROABS 12.1*  --   --   HGB 8.9* 7.4* 6.6*  HCT 26.2* 22.1* 19.7*  MCV 99.6 102.3* 100.5*  PLT 164 147* 139*   Basic Metabolic Panel: Recent Labs  Lab 03/15/23 1316 03/15/23 1826 03/16/23 0534 03/17/23 0339  NA 138  --  138 141  K 4.7  --  3.5 2.8*  CL 107  --  110 111  CO2 19*  --  19* 18*  GLUCOSE 135*  --  89 81  BUN 29*  --  28* 26*  CREATININE 2.35*  --  2.09* 1.66*  CALCIUM 8.0*  --  7.9* 8.2*  MG  --  2.2  --  1.9  PHOS  --  3.5  --  3.6   Liver Function Tests: Recent Labs  Lab 03/15/23 1316 03/16/23 0534 03/17/23 0339  AST 91* 63* 53*  ALT 31 26 24   ALKPHOS 126 101 82  BILITOT 1.2 1.2 2.0*  PROT 6.8 6.5 6.2*  ALBUMIN <1.5* 1.7* 2.2*   Recent Labs  Lab 03/15/23 1316  LIPASE 27   Recent Labs  Lab 03/16/23 0849 03/17/23 0339  AMMONIA 25 52*   Cardiac Enzymes: Recent Labs  Lab 03/15/23 1316  CKTOTAL 138   BNP (last 3 results) Recent Labs    03/16/23 0534  BNP 1,072.4*   CBG: Recent Labs  Lab 03/16/23 1726 03/17/23 0120 03/17/23 0741  03/17/23 0813 03/17/23 1217  GLUCAP 71 73 62* 120* 75    Time spent: 35 minutes  Signed:  Chiquetta Langner  Lucianne Muss  [facility] 03/18/2023 1:08 PM

## 2023-03-18 NOTE — TOC Transition Note (Signed)
Transition of Care [employer]) - CM/SW Discharge Note   Patient Details  Name: Mark Best MRN: [id number] Date of Birth: 1956-06-24  Transition of Care [facility]) CM/SW Contact:  Kreg Shropshire, RN Phone Number: 03/18/2023, 12:32 PM   Clinical Narrative:    Patient will DC to: [facility] in [place] Anticipated DC date: 03/18/23 Family notified: Sister Transport by: None emergent EMS  Per MD patient ready for DC to . RN, patient, patient's family, and facility notified of DC.  TOC signing off.    Final next level of care: Hospice Medical Facility Barriers to Discharge: Barriers Resolved   Patient Goals and CMS Choice CMS Medicare.gov Compare Post Acute Care list provided to:: Patient Choice offered to / list presented to : Sibling  Discharge Placement                  Patient to be transferred to facility by: non emergenct ems Name of family member notified: Sister Gwenyth Allegra Patient and family notified of of transfer: 03/18/23  Discharge Plan and Services Additional resources added to the After Visit Summary for                                       Social Determinants of Health (SDOH) Interventions SDOH Screenings   Food Insecurity: Patient Unable To Answer (03/17/2023)  Housing: Patient Unable To Answer (03/17/2023)  Transportation Needs: Patient Unable To Answer (03/17/2023)  Utilities: Patient Unable To Answer (03/17/2023)  Tobacco Use: High Risk (04/19/2022)     Readmission Risk Interventions     No data to display

## 2023-03-18 NOTE — Progress Notes (Signed)
[facility]- Civil engineer, contracting [facility])    Received request from Transitions of Care Manger for family interest in [facility].  Eligibility has been confirmed.   Met with patient's sisters to confirm interest and explain services.  Family agreeable to transfer today.  Transitions of Care manager aware.  RN please call report to [phone] [facility]) prior to patient leaving the unit.  Please send signed and completed DNR with patient at discharge.  Thank you  Redge Gainer,  [facility] Liaison 336 [phone]

## 2023-03-18 NOTE — Progress Notes (Signed)
Daily Progress Note   Patient Name: Mark Best       Date: 03/18/2023 DOB: Mar 11, 1956  Age: 67 y.o. MRN#: [id number] Attending Physician: Gillis Santa, MD Primary Care Physician: Practice, [facility] Family Admit Date: 03/15/2023  Reason for Consultation/Follow-up: Establishing goals of care  Subjective: Notes reviewed.  In to see patient.  He has been moved out of ICU and is currently resting in bed with eyes closed, even unlabored respirations and no distress noted; sisters at bedside.  He does not awaken with my presence.  Spoke with both sisters which state he only awoke briefly to tell them he loved them.  They state they are waiting for hospice facility placement but have questions regarding hospice facility.  Questions were answered.  Hospice liaison to speak with family regarding transfer.  No changes to symptom management regimen recommended at this time.  Length of Stay: 2  Current Medications: Scheduled Meds:   Chlorhexidine Gluconate Cloth  6 each Topical Daily   midodrine  10 mg Oral TID WC   sodium chloride flush  3 mL Intravenous Q12H    Continuous Infusions:   PRN Meds: acetaminophen **OR** acetaminophen, antiseptic oral rinse, glycopyrrolate **OR** glycopyrrolate **OR** glycopyrrolate, haloperidol **OR** haloperidol **OR** haloperidol lactate, LORazepam **OR** LORazepam **OR** LORazepam, LORazepam **OR** LORazepam, morphine injection, ondansetron **OR** ondansetron (ZOFRAN) IV, polyvinyl alcohol, spiritus frumenti  Physical Exam Constitutional:      Comments: Eyes closed  Pulmonary:     Comments: Even and unlabored            Vital Signs: BP 94/66   Pulse (!) 50   Temp 98.1 F (36.7 C) (Oral)   Resp (!) 21   Ht 5\' 11"  (1.803 m)   Wt 57.4 kg    SpO2 97%   BMI 17.65 kg/m  SpO2: SpO2: 97 % O2 Device: O2 Device: Room Air O2 Flow Rate:    Intake/output summary:  Intake/Output Summary (Last 24 hours) at 03/18/2023 1203 Last data filed at 03/17/2023 2252 Gross per 24 hour  Intake 144.28 ml  Output 600 ml  Net -455.72 ml   LBM: Last BM Date : 03/15/23 Baseline Weight: Weight: 59.4 kg Most recent weight: Weight: 57.4 kg    Patient Active Problem List   Diagnosis Date Noted   Alcohol use disorder 03/15/2023  Normocytic anemia 03/15/2023   AKI (acute kidney injury) (HCC) 03/15/2023   Elevated LFTs 03/15/2023   Aspiration pneumonia (HCC) 03/15/2023   Parapneumonic effusion 03/15/2023   Demand ischemia 03/15/2023   Acute encephalopathy 03/15/2023   Abnormal CT scan, colon    Polyp of transverse colon     Palliative Care Assessment & Plan     Recommendations/Plan: Patient currently under comfort care.  Will be transferred to hospice facility. Patient appears comfortable, no changes to symptom management regimen recommended at this time. Code Status:    Code Status Orders  (From admission, onward)           Start     Ordered   03/17/23 1249  Do not attempt resuscitation (DNR)  Continuous       Question Answer Comment  If patient has no pulse and is not breathing Do Not Attempt Resuscitation   If patient has a pulse and/or is breathing: Medical Treatment Goals COMFORT MEASURES: Keep clean/warm/dry, use medication by any route; positioning, wound care and other measures to relieve pain/suffering; use oxygen, suction/manual treatment of airway obstruction for comfort; do not transfer unless for comfort needs.   Consent: Discussion documented in EHR or advanced directives reviewed      03/17/23 1250           Code Status History     Date Active Date Inactive Code Status Order ID Comments User Context   03/17/2023 1248 03/17/2023 1250 DNR [id number]  Morton Stall, NP Inpatient   03/15/2023 1539 03/17/2023  1248 DNR [id number]  Verdene Lennert, MD ED       Prognosis:  < 2 weeks    Care plan was discussed with hospice liaison  Thank you for allowing the Palliative Medicine Team to assist in the care of this patient.   Morton Stall, NP  Please contact Palliative Medicine Team phone at [phone] for questions and concerns.

## 2023-03-18 NOTE — TOC Transition Note (Signed)
Transition of Care [facility]) - CM/SW Discharge Note   Patient Details  Name: Mark Best MRN: [id number] Date of Birth: [birth year]  [facility] CM/SW Contact:  Allena Katz, LCSW Phone Number: 03/18/2023, 1:20 PM   Clinical Narrative:   Pt discharging to [facility]. Medical necessity printed. Hospice to call [facility].     Final next level of care: Skilled Nursing Facility Barriers to Discharge: Barriers Resolved   Patient Goals and CMS Choice CMS Medicare.gov Compare Post Acute Care list provided to:: Patient Choice offered to / list presented to : Patient  Discharge Placement                  Patient to be transferred to facility by: [facility] Name of family member notified: hospice to notify Patient and family notified of of transfer: 03/18/23  Discharge Plan and Services Additional resources added to the After Visit Summary for                                       Social Determinants of Health (SDOH) Interventions SDOH Screenings   Food Insecurity: Patient Unable To Answer (03/17/2023)  Housing: Patient Unable To Answer (03/17/2023)  Transportation Needs: Patient Unable To Answer (03/17/2023)  Utilities: Patient Unable To Answer (03/17/2023)  Tobacco Use: High Risk (04/19/2022)     Readmission Risk Interventions     No data to display

## 2023-03-19 LAB — CULTURE, BLOOD (ROUTINE X 2): Culture: NO GROWTH

## 2023-03-20 LAB — CULTURE, BLOOD (ROUTINE X 2): Culture: NO GROWTH

## 2023-04-01 DEATH — deceased

## 2023-06-11 IMAGING — CT CT CHEST-ABD-PELV W/ CM
2 of 5 series · 12 of 36 positions shown, 14 images · IV contrast (agent unspecified)
Comparison: None Available.

CLINICAL DATA: Smoker, abdominal pain and leg pain persistent.

EXAM:
CT CHEST, ABDOMEN, AND PELVIS WITH CONTRAST
TECHNIQUE: Multidetector CT imaging of the chest, abdomen and pelvis was
performed following the standard protocol during bolus
administration of intravenous contrast.

[Series 2: cap with · axial · 0.82mm/px · z∈[-750,-220]mm · 9 of 134 slices shown, 11 images]
[im 14/134  mediastinal]
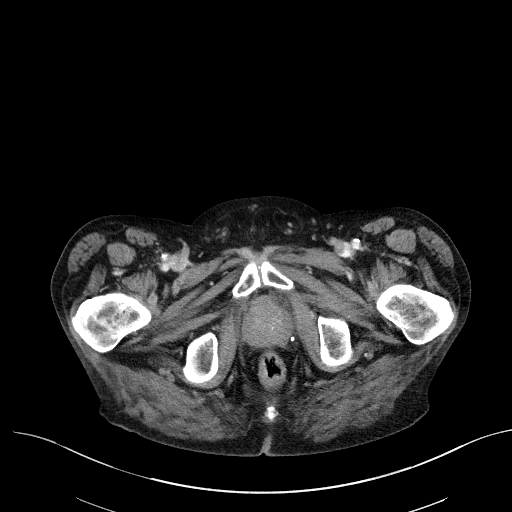
[im 14/134  bone]
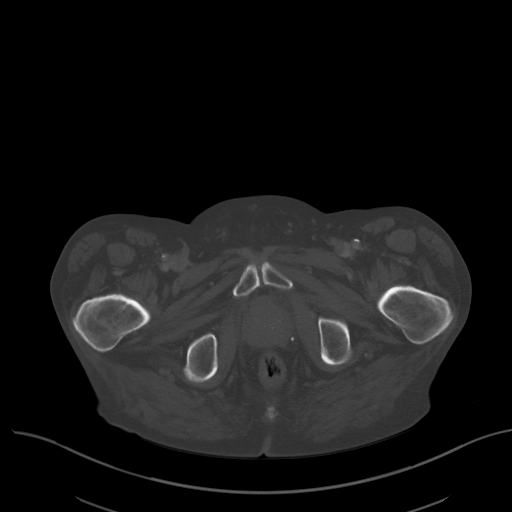
[im 27/134  mediastinal]
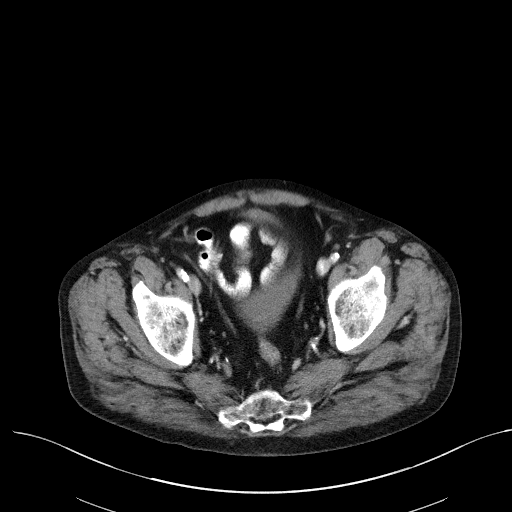
[im 40/134  mediastinal]
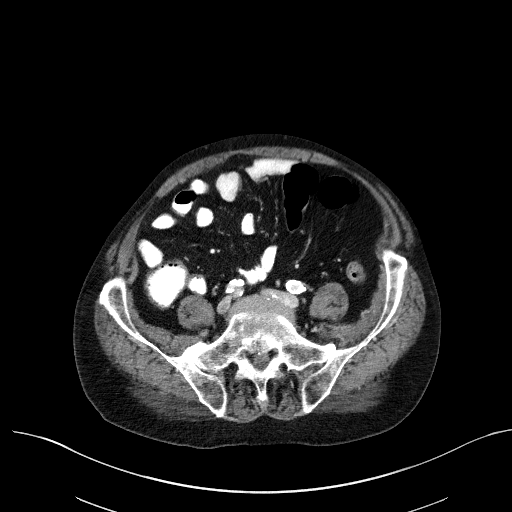
[im 54/134  mediastinal]
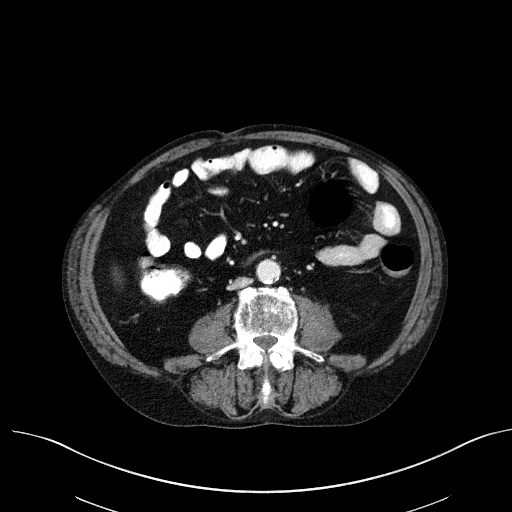
[im 67/134  mediastinal]
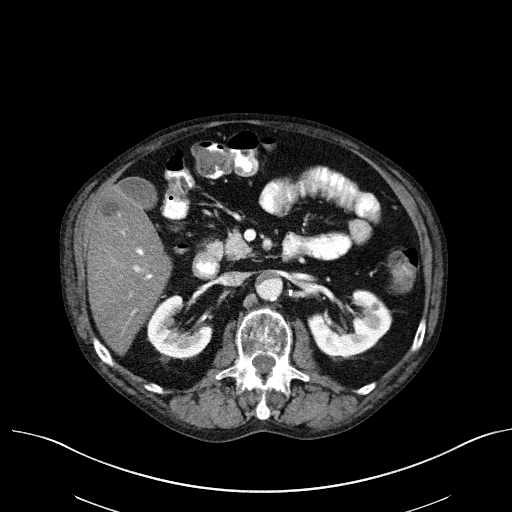
[im 80/134  mediastinal]
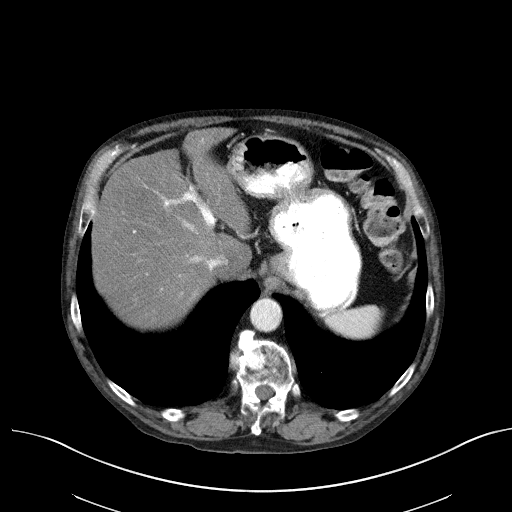
[im 94/134  mediastinal]
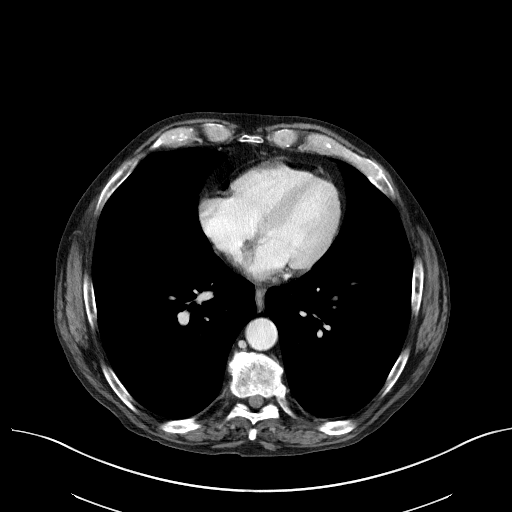
[im 107/134  mediastinal]
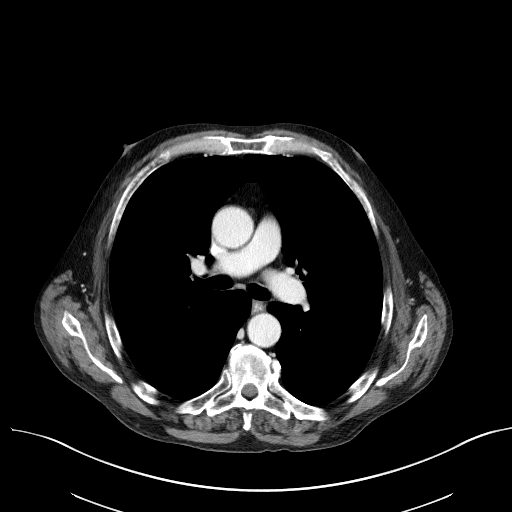
[im 120/134  mediastinal]
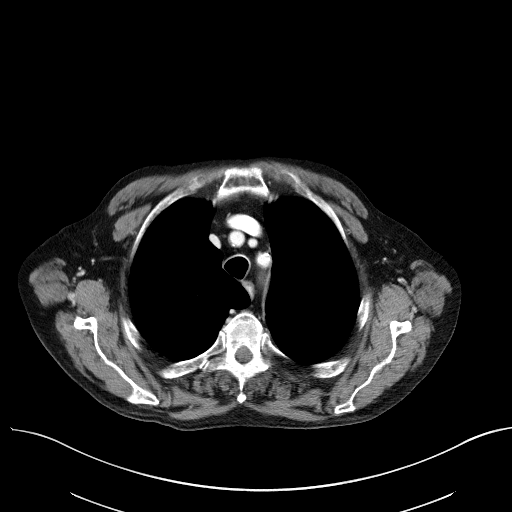
[im 120/134  bone]
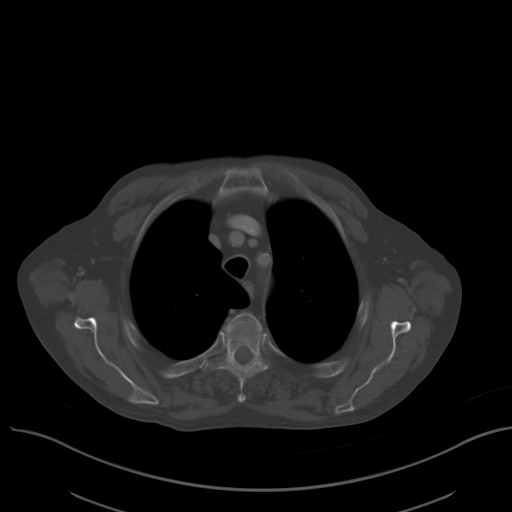

[Series 6: coronals · coronal · 0.72mm/px · 3 of 146 slices shown]
[im 30/146  mediastinal]
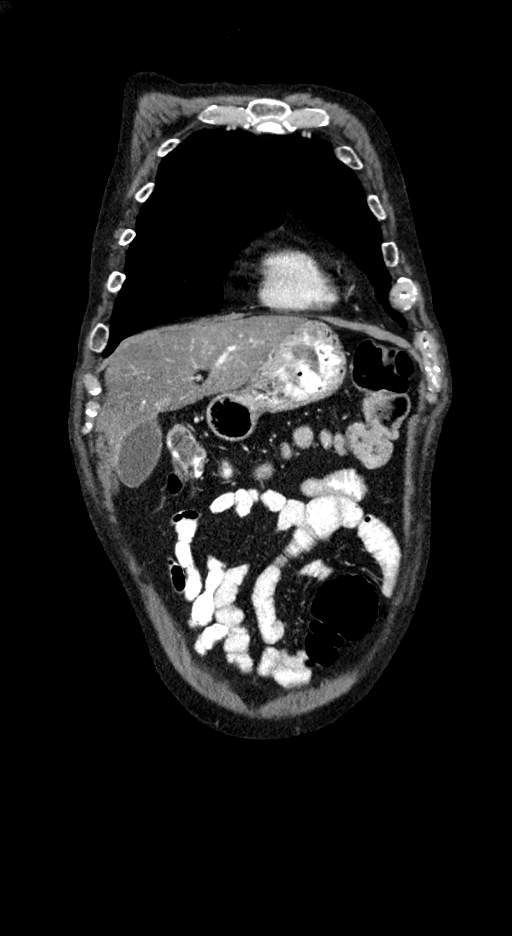
[im 59/146  mediastinal]
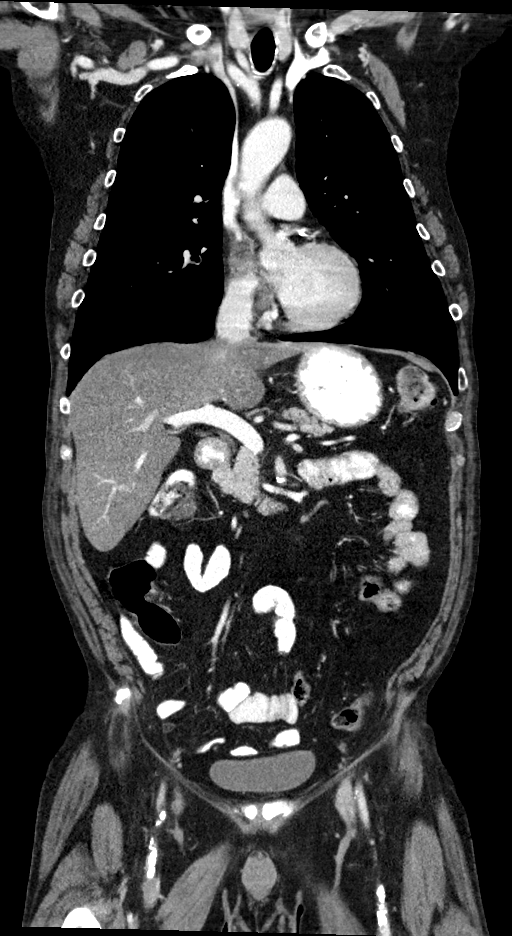
[im 88/146  mediastinal]
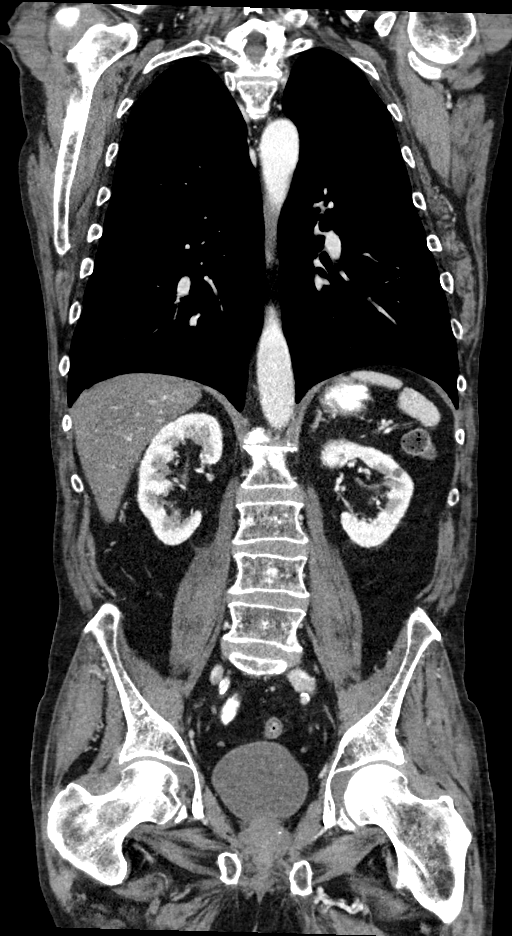

[12 of 36 positions shown; findings below may reference images not displayed]

RADIATION DOSE REDUCTION: This exam was performed according to the
departmental dose-optimization program which includes automated
exposure control, adjustment of the mA and/or kV according to
patient size and/or use of iterative reconstruction technique.

CONTRAST:  100mL OMNIPAQUE IOHEXOL 300 MG/ML  SOLN
FINDINGS: CT CHEST FINDINGS

Cardiovascular: No significant vascular findings. Atherosclerosis of
the aorta is identified. Normal heart size. No pericardial effusion.

Mediastinum/Nodes: No enlarged mediastinal, hilar, or axillary lymph
nodes. Thyroid gland, trachea, and esophagus demonstrate no
significant findings.

Lungs/Pleura: Lungs are clear. No pleural effusion or pneumothorax.

Musculoskeletal: Degenerative joint changes are identified
throughout the spine. There are patchy lucency with sclerosis
throughout the visualized thoracolumbar spine, manubrium and
sternum, sacrum.

CT ABDOMEN PELVIS FINDINGS

Hepatobiliary: There is diffuse low density of the liver consistent
with fatty infiltration. In the anterior segment right lobe liver,
there is a 1.7 x 1.5 cm mild heterogeneous low-density mass. The
gallbladder and biliary tree are normal.

Pancreas: Unremarkable. No pancreatic ductal dilatation or
surrounding inflammatory changes.

Spleen: Normal in size without focal abnormality.

Adrenals/Urinary Tract: Bilateral adrenal glands are normal there
are small low-density lesions in both kidneys, largest measures
cm in the midpole left kidney consistent with simple cysts. No no
hydronephrosis is identified bilaterally. The bladder is normal.
Follow-up is necessary.

Stomach/Bowel: In the right transverse colon, there is bowel wall
thickening with question associated 2 adjacent masses, larger
measures 2.3 x 2.6 cm. There is no small bowel obstruction. The
appendix is not seen but no inflammation is noted cecum. The stomach
is normal.

Vascular/Lymphatic: Atherosclerosis of the abdominal aorta with
calcified and noncalcified plaques are noted. No abdominal or pelvic
lymphadenopathy are noted.

Reproductive: Prostate is unremarkable.

Other: None.

Musculoskeletal: Degenerative joint changes are identified
throughout the spine. There are patchy lucency with sclerosis
throughout the visualized thoracolumbar spine, manubrium and
sternum, sacrum.
IMPRESSION: 1. No acute abnormality identified in the chest.
2. Bowel wall thickening with question associated 2 adjacent masses
throughout the right transverse colon. This is suspicious for
malignancy. Further evaluation with colonoscopy is recommended.
3. Mild heterogeneous density mass in the liver, liver metastasis is
not excluded.
4. Patchy lucency with sclerosis throughout the visualized
thoracolumbar spine, manubrium and sternum, sacrum. Metastatic
disease is not excluded.

Aortic Atherosclerosis (OXTHK-H31.1).

## 2023-07-15 IMAGING — CT CT ABD-PELV W/ CM
2 of 5 series · 15 of 46 positions shown, 17 images · IV contrast (agent unspecified)
Comparison: 01/14/2022

CLINICAL DATA: Colonic polyps on recent colonoscopy. Previous CT
described adjacent mass lesions in the transverse colon.

EXAM:
CT ABDOMEN AND PELVIS WITH CONTRAST
TECHNIQUE: Multidetector CT imaging of the abdomen and pelvis was performed
using the standard protocol following bolus administration of
intravenous contrast.

[Series 2: abd pelvis 5.00 · axial · 0.70mm/px · z∈[-1546,-1121]mm · 12 of 95 slices shown, 14 images]
[im 5/95  soft-tissue]
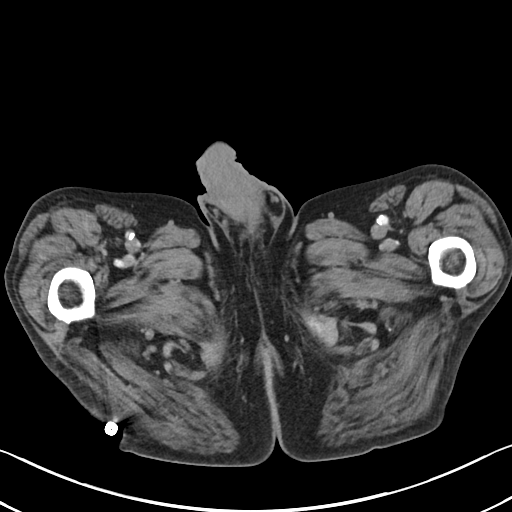
[im 5/95  bone]
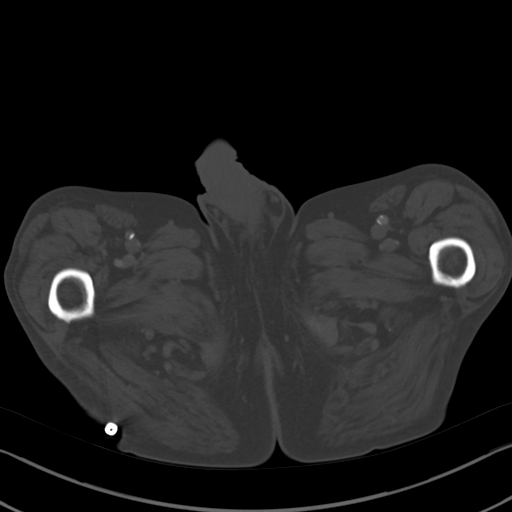
[im 15/95  soft-tissue]
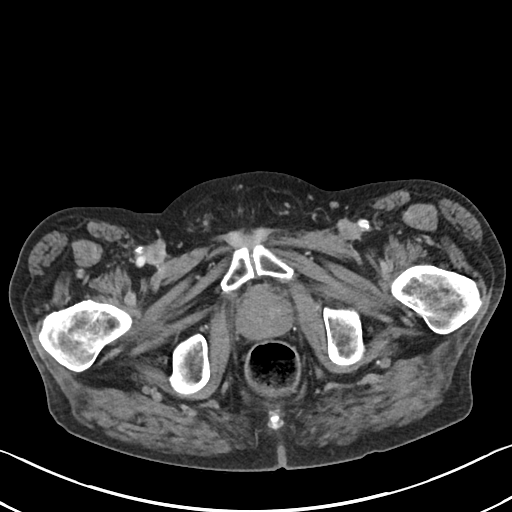
[im 20/95  soft-tissue]
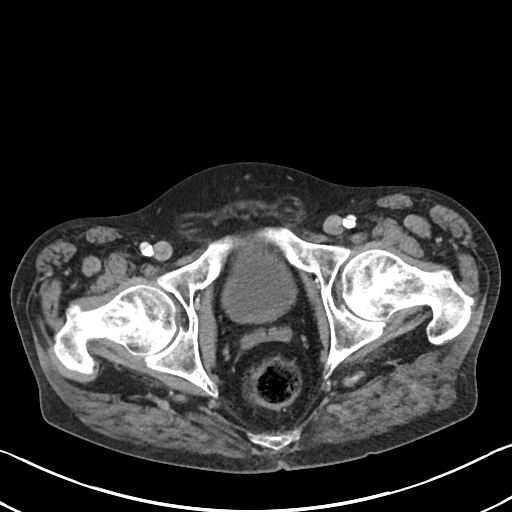
[im 30/95  soft-tissue]
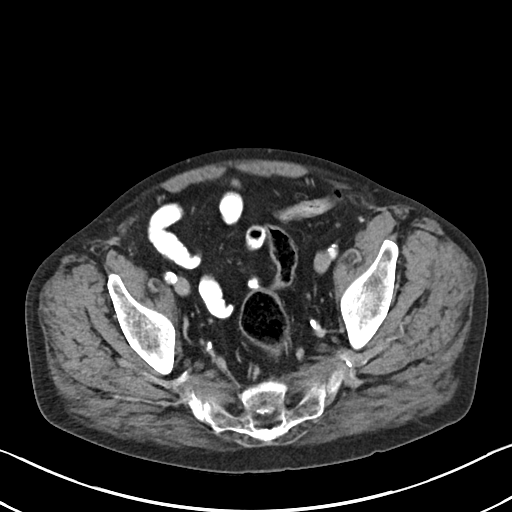
[im 35/95  soft-tissue]
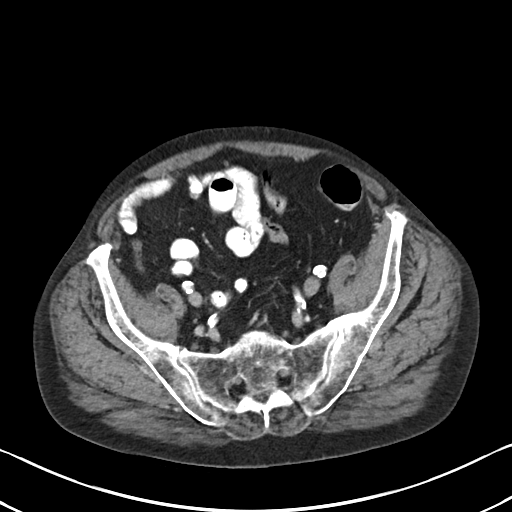
[im 45/95  soft-tissue]
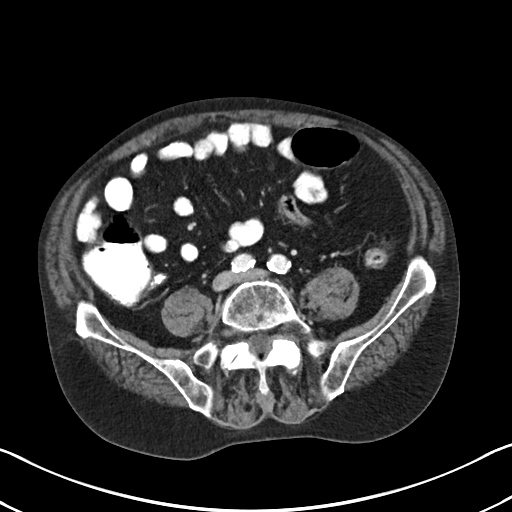
[im 50/95  soft-tissue]
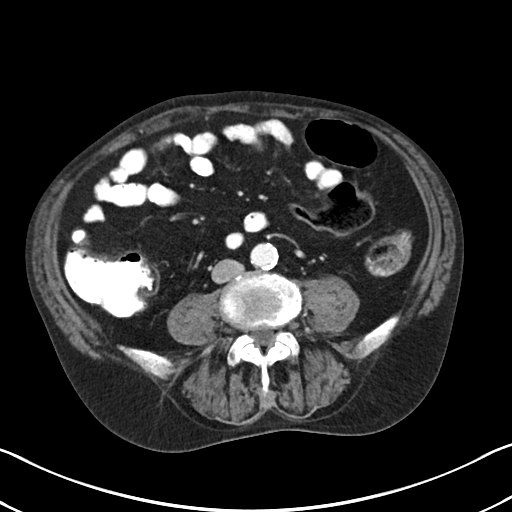
[im 60/95  soft-tissue]
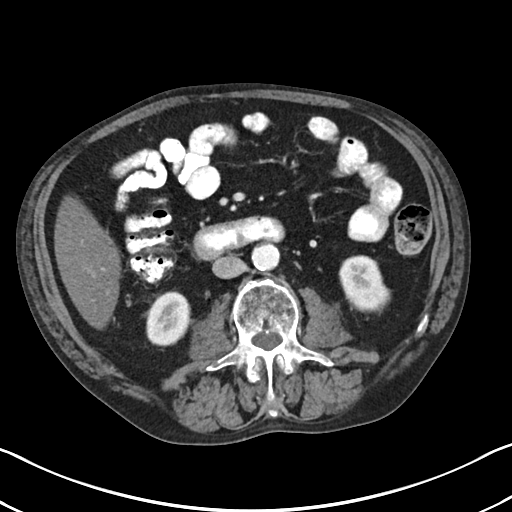
[im 65/95  soft-tissue]
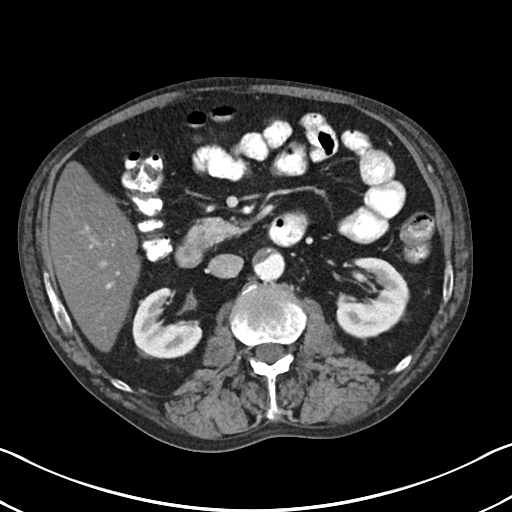
[im 65/95  bone]
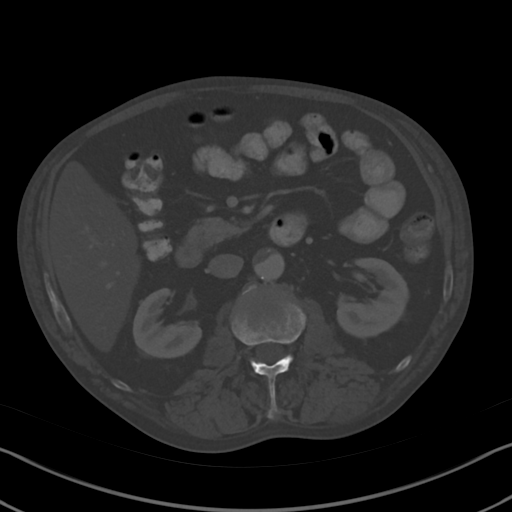
[im 75/95  soft-tissue]
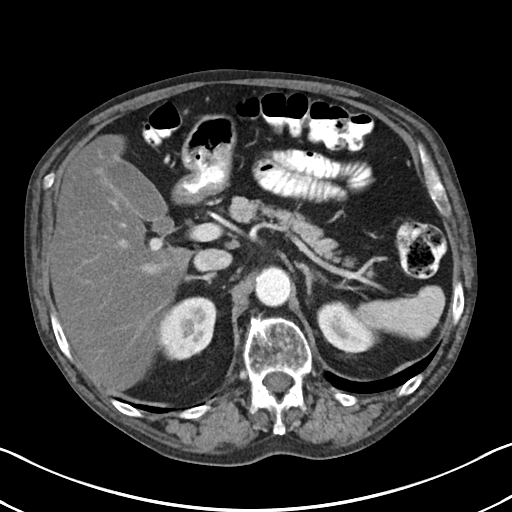
[im 80/95  soft-tissue]
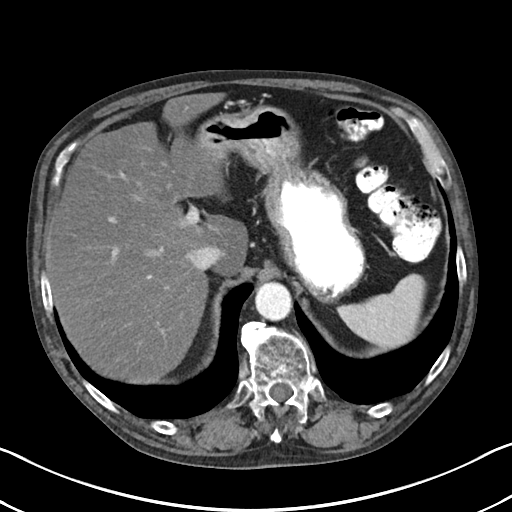
[im 90/95  soft-tissue]
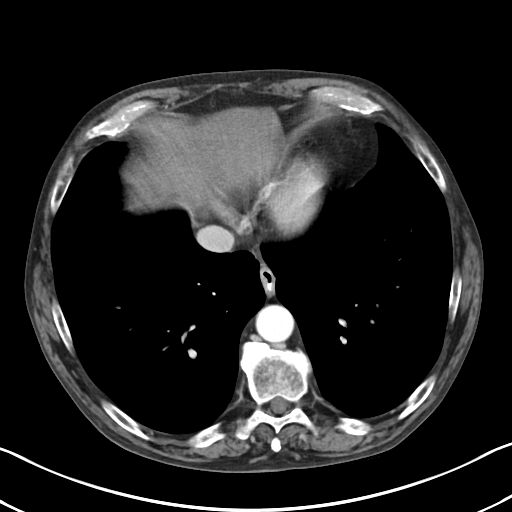

[Series 4: coronals abd pelvis 2.00 cor · coronal · 0.70mm/px · 3 of 146 slices shown]
[im 49/146  soft-tissue]
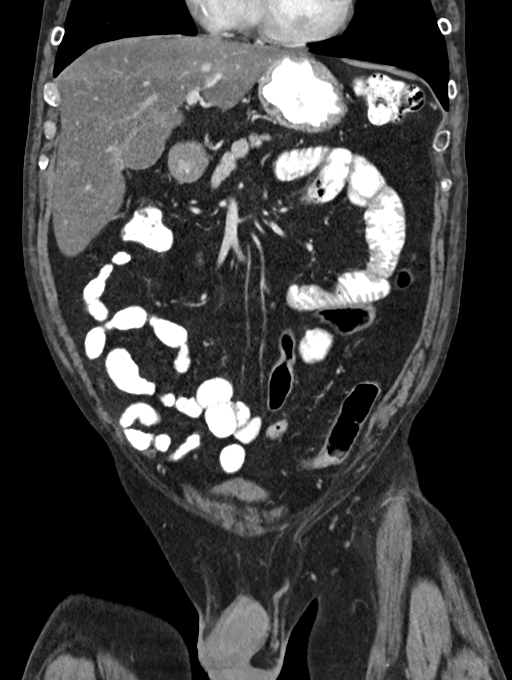
[im 65/146  soft-tissue]
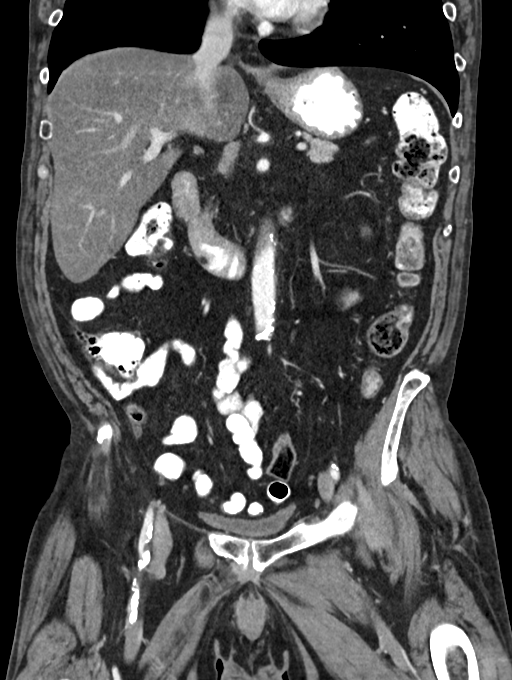
[im 81/146  soft-tissue]
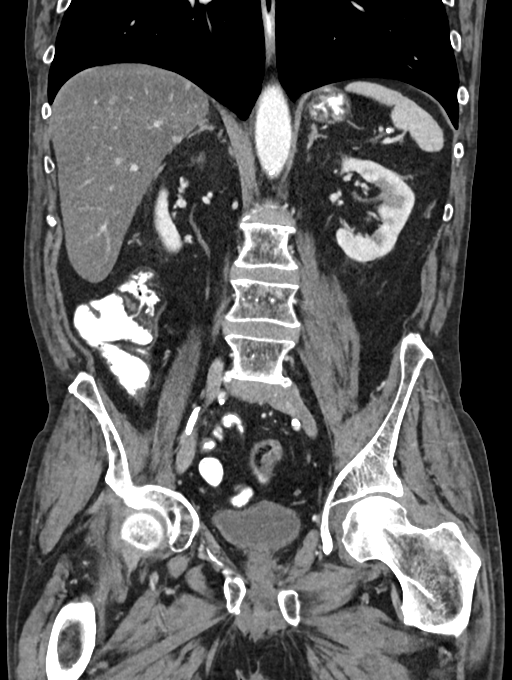

[15 of 46 positions shown; findings below may reference images not displayed]

RADIATION DOSE REDUCTION: This exam was performed according to the
departmental dose-optimization program which includes automated
exposure control, adjustment of the mA and/or kV according to
patient size and/or use of iterative reconstruction technique.

CONTRAST:  85mL OMNIPAQUE IOHEXOL 300 MG/ML  SOLN
FINDINGS: Lower chest: Unremarkable.

Hepatobiliary: The liver shows diffusely decreased attenuation
suggesting fat deposition. No suspicious focal abnormality within
the liver parenchyma. Areas of fatty sparing are seen along the
gallbladder fossa and adjacent to the caudate lobe. There is no
evidence for gallstones, gallbladder wall thickening, or
pericholecystic fluid. No intrahepatic or extrahepatic biliary
dilation.

Pancreas: No focal mass lesion. No dilatation of the main duct. No
intraparenchymal cyst. No peripancreatic edema.

Spleen: No splenomegaly. No focal mass lesion.

Adrenals/Urinary Tract: No adrenal nodule or mass. Kidneys
unremarkable. No evidence for hydroureter. The urinary bladder
appears normal for the degree of distention.

Stomach/Bowel: Stomach is unremarkable. No gastric wall thickening.
No evidence of outlet obstruction. Duodenum is normally positioned
as is the ligament of Treitz. No small bowel wall thickening. No
small bowel dilatation. The terminal ileum is normal. The appendix
is not well visualized, but there is no edema or inflammation in the
region of the cecum. No gross colonic mass. No colonic wall
thickening.

Vascular/Lymphatic: There is moderate atherosclerotic calcification
of the abdominal aorta without aneurysm. There is no gastrohepatic
or hepatoduodenal ligament lymphadenopathy. No retroperitoneal or
mesenteric lymphadenopathy. No pelvic sidewall lymphadenopathy.

Reproductive: The prostate gland and seminal vesicles are
unremarkable.

Other: No intraperitoneal free fluid.

Musculoskeletal: No worrisome lytic or sclerotic osseous
abnormality.
IMPRESSION: 1. No acute findings in the abdomen or pelvis. Specifically, no
evidence for mass lesion in the transverse colon. Appearance on
previous CT compatible with unopacified stool.
2. Hepatic steatosis.
3. Aortic Atherosclerosis ([id number]-VMI.I).
